# Patient Record
Sex: Female | Born: 1980 | Race: White | Hispanic: No | Smoking: Current every day smoker
Health system: Southern US, Community
[De-identification: ages and names within clinical notes are randomized; demographics above are authoritative.]

## PROBLEM LIST (undated history)

## (undated) DIAGNOSIS — M79609 Pain in unspecified limb: Secondary | ICD-10-CM

## (undated) DIAGNOSIS — F1120 Opioid dependence, uncomplicated: Secondary | ICD-10-CM

## (undated) DIAGNOSIS — F419 Anxiety disorder, unspecified: Secondary | ICD-10-CM

## (undated) DIAGNOSIS — Z87820 Personal history of traumatic brain injury: Secondary | ICD-10-CM

## (undated) DIAGNOSIS — F1910 Other psychoactive substance abuse, uncomplicated: Secondary | ICD-10-CM

## (undated) DIAGNOSIS — F32A Depression, unspecified: Secondary | ICD-10-CM

## (undated) DIAGNOSIS — M549 Dorsalgia, unspecified: Secondary | ICD-10-CM

## (undated) DIAGNOSIS — T7840XA Allergy, unspecified, initial encounter: Secondary | ICD-10-CM

## (undated) DIAGNOSIS — M5136 Other intervertebral disc degeneration, lumbar region: Secondary | ICD-10-CM

## (undated) DIAGNOSIS — M545 Low back pain, unspecified: Secondary | ICD-10-CM

## (undated) DIAGNOSIS — F329 Major depressive disorder, single episode, unspecified: Secondary | ICD-10-CM

## (undated) DIAGNOSIS — G4752 REM sleep behavior disorder: Secondary | ICD-10-CM

## (undated) DIAGNOSIS — F411 Generalized anxiety disorder: Secondary | ICD-10-CM

## (undated) DIAGNOSIS — Z8759 Personal history of other complications of pregnancy, childbirth and the puerperium: Secondary | ICD-10-CM

## (undated) DIAGNOSIS — F4310 Post-traumatic stress disorder, unspecified: Secondary | ICD-10-CM

## (undated) HISTORY — DX: Low back pain, unspecified: M54.50

## (undated) HISTORY — PX: CHOLECYSTECTOMY: SHX55

## (undated) HISTORY — DX: Post-traumatic stress disorder, unspecified: F43.10

## (undated) HISTORY — DX: Generalized anxiety disorder: F41.1

## (undated) HISTORY — DX: REM sleep behavior disorder: G47.52

## (undated) HISTORY — DX: Major depressive disorder, single episode, unspecified: F32.9

## (undated) HISTORY — DX: Other intervertebral disc degeneration, lumbar region: M51.36

## (undated) HISTORY — DX: Depression, unspecified: F32.A

## (undated) HISTORY — DX: Pain in unspecified limb: M79.609

## (undated) HISTORY — DX: Personal history of traumatic brain injury: Z87.820

## (undated) HISTORY — DX: Allergy, unspecified, initial encounter: T78.40XA

## (undated) HISTORY — DX: Personal history of other complications of pregnancy, childbirth and the puerperium: Z87.59

---

## 2002-09-23 ENCOUNTER — Inpatient Hospital Stay (HOSPITAL_COMMUNITY): Admission: AD | Admit: 2002-09-23 | Discharge: 2002-09-24 | Payer: Self-pay | Admitting: Obstetrics and Gynecology

## 2002-09-24 ENCOUNTER — Encounter: Payer: Self-pay | Admitting: Obstetrics and Gynecology

## 2005-08-10 ENCOUNTER — Other Ambulatory Visit (HOSPITAL_COMMUNITY): Admission: RE | Admit: 2005-08-10 | Discharge: 2005-11-08 | Payer: Self-pay | Admitting: Psychiatry

## 2006-03-21 ENCOUNTER — Ambulatory Visit (HOSPITAL_COMMUNITY): Admission: RE | Admit: 2006-03-21 | Discharge: 2006-03-21 | Payer: Self-pay | Admitting: Psychiatry

## 2006-05-29 ENCOUNTER — Emergency Department (HOSPITAL_COMMUNITY): Admission: EM | Admit: 2006-05-29 | Discharge: 2006-05-29 | Payer: Self-pay | Admitting: Emergency Medicine

## 2006-09-13 ENCOUNTER — Inpatient Hospital Stay (HOSPITAL_COMMUNITY): Admission: AD | Admit: 2006-09-13 | Discharge: 2006-09-13 | Payer: Self-pay | Admitting: Obstetrics and Gynecology

## 2010-03-07 ENCOUNTER — Encounter: Payer: Self-pay | Admitting: Psychiatry

## 2010-11-29 LAB — KLEIHAUER-BETKE STAIN: Quantitation Fetal Hemoglobin: 0

## 2012-09-20 ENCOUNTER — Inpatient Hospital Stay: Payer: Self-pay | Admitting: Internal Medicine

## 2012-09-20 LAB — BASIC METABOLIC PANEL
Anion Gap: 10 (ref 7–16)
BUN: 13 mg/dL (ref 7–18)
Calcium, Total: 8.3 mg/dL — ABNORMAL LOW (ref 8.5–10.1)
Creatinine: 1.38 mg/dL — ABNORMAL HIGH (ref 0.60–1.30)
EGFR (Non-African Amer.): 51 — ABNORMAL LOW
Glucose: 125 mg/dL — ABNORMAL HIGH (ref 65–99)
Osmolality: 274 (ref 275–301)
Potassium: 1.4 mmol/L — CL (ref 3.5–5.1)
Sodium: 136 mmol/L (ref 136–145)

## 2012-09-20 LAB — COMPREHENSIVE METABOLIC PANEL
Albumin: 3.2 g/dL — ABNORMAL LOW (ref 3.4–5.0)
Alkaline Phosphatase: 89 U/L (ref 50–136)
Anion Gap: 8 (ref 7–16)
Co2: 25 mmol/L (ref 21–32)
EGFR (African American): >60
Glucose: 125 mg/dL — ABNORMAL HIGH (ref 65–99)
Osmolality: 268 (ref 275–301)
Potassium: 1.6 mmol/L — CL (ref 3.5–5.1)
SGOT(AST): 37 U/L (ref 15–37)
Sodium: 133 mmol/L — ABNORMAL LOW (ref 136–145)

## 2012-09-20 LAB — CBC
HGB: 17.4 g/dL — ABNORMAL HIGH (ref 12.0–16.0)
MCHC: 35.5 g/dL (ref 32.0–36.0)
MCV: 75 fL — ABNORMAL LOW (ref 80–100)

## 2012-09-20 LAB — URINALYSIS, COMPLETE
Bilirubin,UR: NEGATIVE
Ketone: NEGATIVE
Nitrite: NEGATIVE
RBC,UR: <1 /HPF (ref 0–5)
Squamous Epithelial: 2

## 2012-09-20 LAB — DRUG SCREEN, URINE
Barbiturates, Ur Screen: NEGATIVE (ref ?–200)
Cocaine Metabolite,Ur ~~LOC~~: NEGATIVE (ref ?–300)
Methadone, Ur Screen: NEGATIVE (ref ?–300)

## 2012-09-21 LAB — BASIC METABOLIC PANEL
Calcium, Total: 7.5 mg/dL — ABNORMAL LOW (ref 8.5–10.1)
Chloride: 111 mmol/L — ABNORMAL HIGH (ref 98–107)
Chloride: 114 mmol/L — ABNORMAL HIGH (ref 98–107)
Co2: 21 mmol/L (ref 21–32)
Creatinine: 1.35 mg/dL — ABNORMAL HIGH (ref 0.60–1.30)
Creatinine: 1.38 mg/dL — ABNORMAL HIGH (ref 0.60–1.30)
EGFR (African American): 59 — ABNORMAL LOW
Glucose: 120 mg/dL — ABNORMAL HIGH (ref 65–99)
Osmolality: 282 (ref 275–301)
Potassium: 1.7 mmol/L — CL (ref 3.5–5.1)
Potassium: 2 mmol/L — CL (ref 3.5–5.1)
Sodium: 141 mmol/L (ref 136–145)

## 2012-09-21 LAB — CBC WITH DIFFERENTIAL/PLATELET
Eosinophil #: 1.6 10*3/uL — ABNORMAL HIGH (ref 0.0–0.7)
Eosinophil %: 7.8 %
HGB: 13.6 g/dL (ref 12.0–16.0)
MCHC: 36.4 g/dL — ABNORMAL HIGH (ref 32.0–36.0)
Neutrophil #: 14 10*3/uL — ABNORMAL HIGH (ref 1.4–6.5)
Platelet: 192 10*3/uL (ref 150–440)
RBC: 5.03 10*6/uL (ref 3.80–5.20)
RDW: 14.6 % — ABNORMAL HIGH (ref 11.5–14.5)
WBC: 20.3 10*3/uL — ABNORMAL HIGH (ref 3.6–11.0)

## 2012-09-21 LAB — MAGNESIUM: Magnesium: 2.2 mg/dL

## 2012-09-21 LAB — CLOSTRIDIUM DIFFICILE BY PCR

## 2012-09-22 LAB — CBC WITH DIFFERENTIAL/PLATELET
Basophil #: 0.1 10*3/uL (ref 0.0–0.1)
Basophil %: 0.5 %
Eosinophil %: 8.9 %
HCT: 32.9 % — ABNORMAL LOW (ref 35.0–47.0)
Lymphocyte #: 2.9 10*3/uL (ref 1.0–3.6)
Lymphocyte %: 21.2 %
MCH: 27 pg (ref 26.0–34.0)
Monocyte #: 0.7 x10 3/mm (ref 0.2–0.9)
Neutrophil %: 64.1 %
Platelet: 162 10*3/uL (ref 150–440)
RBC: 4.34 10*6/uL (ref 3.80–5.20)

## 2012-09-22 LAB — BASIC METABOLIC PANEL
Calcium, Total: 7.4 mg/dL — ABNORMAL LOW (ref 8.5–10.1)
Creatinine: 0.88 mg/dL (ref 0.60–1.30)
Osmolality: 279 (ref 275–301)
Potassium: 3.2 mmol/L — ABNORMAL LOW (ref 3.5–5.1)

## 2012-09-22 LAB — MAGNESIUM: Magnesium: 1.9 mg/dL

## 2012-09-23 LAB — STOOL CULTURE

## 2013-07-14 ENCOUNTER — Emergency Department: Payer: Self-pay | Admitting: Emergency Medicine

## 2013-07-14 ENCOUNTER — Emergency Department: Payer: Self-pay | Admitting: Internal Medicine

## 2014-06-06 NOTE — Discharge Summary (Signed)
PATIENT NAME:  Vicki [name] [name], Vicki [name] MR#:  [id number] DATE OF BIRTH:  02-23-1980  DATE OF ADMISSION:  09/20/2012 DATE OF DISCHARGE:  09/22/2012  ADMITTING DIAGNOSES:  Intractable diarrhea.   DISCHARGE DIAGNOSES:   1.  Hypokalemia, hypomagnesium due to diarrhea.  2.  Diarrhea, likely food poisoning per gastroenterology. 3.  Leukocytosis.  4.  Oral thrush.   5.  History of lower back pains.   DISCHARGE CONDITION:  Stable.   DISCHARGE MEDICATIONS:  The patient is to resume alprazolam 1 mg 3 times daily as needed.  Nystatin 5 mL every six hours.  This is a new medication.  Potassium chloride 20 mEq by mouth twice daily, magnesium oxide 400 mg by mouth twice daily.   DIET:  Regular, mechanical soft.  The patient was advised to advance to regular in the next few days.   ACTIVITY LIMITATIONS:  As tolerated.   FOLLOW-UP APPOINTMENT:  With Dr. Isabella Bowensld in two days after discharge.  The patient was advised also to have magnesium as well as potassium levels checked in the next two days and to have it reported to Dr. Joaquin Musicld's (Dictation Anomaly) office.  Decision then should be made in regards to supplementation, potassium as well as magnesium supplementation orally.   CONSULTANTS:   1.  Care management, Ms. Amedeo KinsmanKimberly Mills.  2.  Dr. Lynnae Prudeobert Elliott.  3.  Dr. Ida Roguehristopher Lundquist.   PROCEDURES:  Ultrasound-guided right internal jugular central venous catheter placement on 09/20/2012.   HOSPITAL COURSE:   1.  The patient is a 34 year old Caucasian female with history of diarrhea who presented to the hospital with complaints of difficulty with walking.  Please refer to Dr. Serita GritShreyang Patel's admission note on 09/20/2012.  Apparently the patient had illness approximately 15 days ago prior to coming to the hospital which sounded like food poisoning.  She was over it after a few days, however she walked in the heat for approximately two miles yesterday on 09/20/2012 and had severe muscular problems as well as weakness.   She presented to the hospital for further evaluation, was found to be hypokalemic and was admitted.  She still was having intermittent loose stools.  Because of concern of Salmonella as well as Campylobacter, Dr. Mechele CollinElliott felt that the patient's diarrhea should not be treated with antidiarrheal medications until stool cultures are negative.  Stool cultures were taken and were found to be negative for Salmonella, Shigella, Campylobacter or pathogenic E. coli.  With conservative treatment, IV fluid administration as well as nausea medications, the patient's condition improved.  On the day of admission 09/20/2012 the patient was noted to be in mild renal insufficiency with creatinine level at 1.33.  Her sodium level was low at 133 and potassium level was very low at 1.6.  Magnesium level was also low at 1.4.  The patient's electrolytes were replenished.  On day of discharge 09/22/2012 the patient's potassium is 3.2.  The patient's magnesium level is 1.9.  The patient was advised to continue magnesium as well as potassium supplementation and have magnesium as well as potassium levels rechecked in the next few days after discharge and report it to Dr. Isabella Bowensld.  She is also recommended to return back to Dr. Mechele CollinElliott if any other problems or diarrhea recur.  She was also checked for C. difficile and that was negative.   2.  The patient was noted to have also thrush for which she was given nystatin suspension.  She is to continue this medication until oral thrush resolves.  3.  For history of lower back pains, the patient is to continue her usual management.   She is being discharged in stable condition with the above-mentioned medications and follow-up.    Her vital signs on the day of discharge temperature is 97.9, pulse was 80s, respiratory rate was 15 to 20, blood pressure 119/79, O2 sats were 100% on room air at rest.   TIME SPENT:  40 minutes.    ____________________________ Katharina Caper,  MD rv:ea D: 09/22/2012 15:18:36 ET T: 09/22/2012 23:09:29 ET JOB#: [id number]  cc: Katharina Caper, MD, <Dictator> Anjelita Sheahan MD ELECTRONICALLY SIGNED 09/23/2012 19:09

## 2014-06-06 NOTE — Consult Note (Signed)
PATIENT NAME:  Vicki [name], DUNGEE MR#:  [id number] DATE OF BIRTH:  [birth year]  DATE OF CONSULTATION:  09/21/2012  REFERRING PHYSICIAN:  Dr. Imogene Burn   CONSULTING PHYSICIAN: Molly Maduro Elliott/Terriann Difonzo Arvilla Market, ANP   REASON FOR CONSULTATION: Diarrhea.   HISTORY OF PRESENT ILLNESS: This patient reports she is normally healthy and had an acute episode of nausea, vomiting and diarrhea that she is certain is related to food poisoning from a poorly cooked chicken thigh. She was vacationing in [place] and had eaten at a friend's house. Four people developed acute diarrheal symptoms about 36 to 48 hours after eating this chicken. The patient says she has had food poisoning in the past and she is certain this is what her diarrhea was from. During this timeframe, she has also had urinary tract infection and was given a 7-day course of Cipro which she completed. Her diarrheal illness lasted about six days and then eased off to the point where she was having a formed stool. She never had fevers or chills or abdominal pain or bloody stools. Diarrheal illness, she has had decreased appetite and she lost about 20 pounds in a week's time.   The patient was able to be well enough to drive across country with her boyfriend. She went on a mile walk on Wednesday and had been drinking water, tea and eating one meal a day. She noticed her right foot felt numb and she was more weak and fatigued than normal. She thought perhaps the right foot symptoms were secondary to her history of three herniated back disc. Last night at about 2:00 in the morning, both legs were numb and so weak she was unable to walk. She fell on the way to the bathroom. Her parents called EMS and she was transported to the Emergency Room where she was found to have potassium of 1.6 to 1.4 and leukocytosis. She was subsequently admitted to the hospital for further evaluation and management. Gastroenterology has been consulted surgery regarding her diarrhea.   The  patient reports her bowel habits are always normal. She has had no problems with irritable bowel syndrome. No history of colonoscopy. She has had problems with morbid obesity and she has a 34 year old she went on a crash diet and lost 100 in one summer. She then developed gallstones that were trapped in her bile duct and had to have an emergency cholecystectomy at age 34. The patient reports that her weight has waxed and waned. Recently she is down 20 pounds as noted. Her appetite not yet back to normal, and except for today she was able to eat her a good meal and several days which was vegetable lasagna and home fries. She had three loose stools today. The patient was given antidiarrheal medication and that seemed to check the stool. She denies any abdominal pain, rectal bleeding or upper gastrointestinal complaints.   PAST MEDICAL HISTORY: 1.  Morbid obesity.  2.  Herniated disc.   PAST SURGICAL HISTORY: 1.  Cesarean section.  2.  Cholecystectomy and the patient reports she had multiple gallstones requiring emergency cholecystectomy following profound acute weight loss.  MEDICATIONS ON ARRIVAL:  1.  Alprazolam 1 mg t.i.d.  2.  Oxycodone 6 tablets daily for back pain.   ALLERGIES: AMOXICILLIN.   HABITS: Positive tobacco 1/2 pack per day. Denies alcohol. Denies drug use.   FAMILY HISTORY: Father with diabetes. Negative for colon cancer, colon polyps, or GI malignancy.   REVIEW OF SYSTEMS:  CONSTITUTIONAL: Denies fevers, still has a little fatigue  and weakness but states she feels a lot better from admission.  EYES: Denies any changes.  ENT: Denies any hearing change, ear pain.  RESPIRATORY: Denies cough, shortness of breath, dyspnea on exertion.  CARDIOVASCULAR: Denies chest pain or arrhythmia.  GASTROINTESTINAL: Positive nausea, appetite is still off, but she feels like she has getting hungry and wanting to eat. Loose stool today x 3. Denies rectal bleeding.  GENITOURINARY: History of  urinary tract infection a few weeks ago. Denies hematuria, dysuria or frequency.  ENDOCRINE: Denies diabetes, thyroid problems.  HEMATOLOGIC: Denies bruising.  SKIN: No acute rash. No hair or skin changes.  MUSCULOSKELETAL: Does  have chronic pain in the back, requires narcotics.  Leg weakness and numbness have resolved.   PSYCHIATRIC: Denies anxiety or depression says. She says she is getting tired of lying in the bed.   PHYSICAL EXAMINATION: VITAL SIGNS: Temperature 97.7, respirations 88, pulse 20, blood pressure 117/79, oxygen saturation on room air is 100%.  GENERAL: Well-appearing, obese Caucasian female resting comfortably in bed.  HEENT: Head is normocephalic. Conjunctivae pink. Sclerae anicteric. Oral mucosa is dry and intact.  NECK: Supple without thyromegaly or lymphadenopathy.  HEART: Heart tones S1, S2 without murmur, rub, or gallop.  LUNGS: Clear to auscultation. Respirations are eupneic.  ABDOMEN: Soft, very mild tenderness left lower quadrant without rigidity, rebound, or guarding.  RECTAL: Deferred.  EXTREMITIES: Lower extremities with obesity and may have slight edema. Noted no cyanosis or clubbing.  MUSCULOSKELETAL: The patient is able to sit up in bed independently. Moves about independently. Extremities x 4 with movement.  NEUROLOGIC:  Cranial nerves II through XII grossly intact. Reflexes not checked.  LABORATORY DATA:  Admission blood work 09/20/2012 with glucose 125, sodium 133, BUN 12, creatinine 1.33, potassium 1.6.   CO2 25, magnesium 1.4. Hemoglobin A1c 5.4, total bilirubin 1.3, albumin 3.2. WBC 21, hemoglobin 17.4, HIV negative. Urine pregnancy negative. Urine drug screen positive for benzodiazepines, positive for cannabinoid, positive for opiate.   Repeat laboratory studies through 09/21/2012 now with potassium today 3.5, most recent creatinine 1.38, TSH 0.71, WBC 20.3, hemoglobin 13.6, MCV 74. Blood culture growth negative. Stool negative for C. difficile.  Comprehensive stool study ordered not obtained, ordered and pending collection.   RADIOLOGY: Single view portable chest with right jugular central line and central line in place, hazy density seen in the right care perihilar region, which could be secondary to shallow inspiration. Minimal perihilar atelectasis or evolving pneumonia is not excluded.   IMPRESSION: The patient gives history of visibly poorly cooked chicken thigh and acute vomiting, diarrheal illness 36 to 48 hours after ingesting meat. She is one of four friends who acquired this food poisoning. Acute diarrhea with severe for about six days. In this timeframe she did have the use of Cipro 500 b.i.d. for seven days for urinary tract infection. Diarrheal illness, eventually improved to the point where she was having formed stool, although consistency not quite back to normal and she has persistent low appetite and 20 pounds weight loss in a week. She developed severe extremity weakness and numbness and was found to have severe hypokalemia. She has had leukocytosis reactive to GI illness and stool for C. difficile was negative and blood culture negative. She has had no febrile illness and feels improved. Mild tenderness left lower quadrant on exam today is consistent with likely bacterial diarrheal colitis.  The abdomen is soft, nondistended, and the patient is unaware of abdominal pain without direct palpation. Electrolytes have improved and the patient  says she feels overall much improved. She did eat an aggressive diet today as a first meal. She did eat a high fiber diet today with some grease and developed three loose bowel movements when checked with antidiarrheal medication.   PLAN: Reduce diet to at least low roughage diet. Mild tenderness left lower quadrant noted,  leukocytosis, still present and diarrhea returned with diet. Would recommend decreasing to low residue at least, possibly full liquid, obtain stool for comprehensive culture,  rule out Campylobacter/Salmonella given the exposure to the poorly cooked chicken. Further gastroenterology recommendations per Dr. Mechele CollinElliott review. This case will be discussed with Dr. Mechele CollinElliott in collaboration of care. Thank you for the consultation.    ____________________________ Ranae PlumberKimberly A. Arvilla MarketMills, ANP (Adult Nurse Practitioner) kam:cc D: 09/21/2012 17:29:54 ET T: 09/21/2012 21:22:19 ET JOB#: [id number]  cc: Cala BradfordKimberly A. Arvilla MarketMills, ANP (Adult Nurse Practitioner), <Dictator> Ranae PlumberKimberly A. Suzette BattiestMills RN, MSN, ANP-BC Adult Nurse Practitioner ELECTRONICALLY SIGNED 09/26/2012 12:13

## 2014-06-06 NOTE — Consult Note (Signed)
CC: severe hypokalemia.  Pt had illness 15 d ago sounds like food poisoning, got over it after 6 days.  Walked in heat for 2 miles yesterday had severe muscular problems and weakness.  Found to be severely hypokalemic and admitted.  Pt with some loose stools today.  It is possible to have relapses of Salmonella or Campylobacter  so it is important not to treat her diarrhea with antidiarrheals until we have a stool culture that is neg.  C, diff today was neg.  Pt did take cipro for uti after her food poisoning and that may have been enough.  I will follow with you.  Electronic Signatures: Scot JunElliott, Nikoletta Varma T (MD)  (Signed on 08-Aug-14 20:44)  Authored  Last Updated: 08-Aug-14 20:44 by Scot JunElliott, Kennen Stammer T (MD)

## 2014-06-06 NOTE — Op Note (Signed)
PATIENT NAME:  Vicki [name], Vicki [name] MR#:  [id number] DATE OF BIRTH:  06-11-1980  DATE OF PROCEDURE:  09/20/2012  ATTENDING PHYSICIAN: Salome Holmeshris Cass Edinger, M.D.   PREOPERATIVE DIAGNOSES: Hypokalemia, poor intravenous access.   POSTOPERATIVE DIAGNOSES: Hypokalemia, poor intravenous access.   PROCEDURE PERFORMED: Right internal jugular central venous catheter placement.   ESTIMATED BLOOD LOSS: 5 mL.   COMPLICATIONS: None.   SPECIMENS: None.   ANESTHESIA: Lidocaine 1% with epinephrine.   INDICATION FOR SURGERY: As follows: [name] is a pleasant 34 year old female who presented with profound hypokalemia. She was in need of IV access, and, thus, she was consented for central venous catheter placement.   DETAILS OF PROCEDURE: As follows: Informed consent was obtained. [name] was laid in the Trendelenburg position. An ultrasound was placed over her neck, and her right internal jugular vein was a larger caliber than her left. It also collapsed with inhalation, indicating dehydration. I, thus, then prepped her neck and draped her in sterile surgical fashion. A timeout was then performed correctly identifying the patient name, operative site and procedure to be performed. Using an ultrasound, I visualized her internal jugular vein and under direct visualization after 2 sticks, I accessed it easily. There was return of nonpulsatile blood. I then placed the wire through the needle and removed the needle. I then dilated the tract and then placed a central venous catheter over the wire and removed the wire. I then flushed the catheter, all 3 ports. There was easy flow and flush of the catheter. I then sutured it in place with a 3-0 silk suture. A sterile dressing was then placed over the catheter. Postprocedure chest x-ray showed appropriate position of the catheter without obvious pneumothorax. Needle, sponge and instrument counts were correct at the end of the procedure.     ____________________________ Si Raiderhristopher A. Gotti Alwin, MD cal:gb D: 09/20/2012 20:30:15 ET T: 09/21/2012 04:33:14 ET JOB#: [id number]  cc: Cristal Deerhristopher A. Apolinar Bero, MD, <Dictator> Jarvis NewcomerHRISTOPHER A Hideko Esselman MD ELECTRONICALLY SIGNED 09/28/2012 10:50

## 2014-06-06 NOTE — H&P (Signed)
PATIENT NAME:  Vicki [name], Vicki [name] MR#:  [id number] DATE OF BIRTH:  05/04/1980  DATE OF ADMISSION:  09/20/2012  ED REFERRING PHYSICIAN: Dr. Lenard Lance.   PRIMARY CARE PHYSICIAN: None local. She has a physician in [place].   CHIEF COMPLAINT: Difficulty with walking.   HISTORY OF PRESENT ILLNESS: The patient is a 34 year old Caucasian female with history of having chronic low back pain who came to the ED with difficulty with ambulation. The patient apparently was sick for the past few days. She reports that she developed diarrhea, nausea, vomiting about five days ago, which was severe. She was going at least 20 times a day. Her boyfriend had similar type of symptoms, initially and then she got a day same symptoms. She also reports that she was treated with Cipro about a week ago for urinary tract infection. She has not had any fevers or chills. She thinks that they may have gotten a food poisoning. Her symptoms continued to persist and there severe until yesterday when her nausea, vomiting, diarrhea, also subsided. She did not have any abdominal pain. She did not have any blood. The patient yesterday had difficulty with walking and she progressively got weak and came to the ED today. She was noted to have severe per hypokalemia with potassium of 1.6. She also has a leukocytosis and we are asked to admit the patient. The patient reports that she just feels very weak and tired, but does not have any chest pain. No shortness of breath. No palpitations. No abdominal pain. No more nausea, vomiting or diarrhea. Denies any fevers or chills.   PAST MEDICAL HISTORY: Significant for chronic low back pain and has a herniated disk in her lower back.   PAST SURGICAL HISTORY: Status post C-section.   ALLERGIES: AMOXICILLIN.   CURRENT MEDICATIONS: She says she is on alprazolam 1 mg t.i.d. as needed for anxiety.   SOCIAL HISTORY: Smokes about 1/2 pack per day. Denies any alcohol or drug use.   FAMILY HISTORY: No history  of Crohn's disease or  ulcerative colitis.   REVIEW OF SYSTEMS:  CONSTITUTIONAL: Denies any fevers. Complains of fatigue, weakness, has chronic back pain, did report that she has lost weight over the past few months.  EYES: No blurred or double vision. No pain. No redness. No inflammation. No glaucoma. No cataracts.  ENT: No tinnitus. No ear pain. No hearing loss. No seasonal or year-round allergies. No difficulty with swallowing.  RESPIRATORY: Denies any cough, wheezing, hemoptysis. No dyspnea. No chronic obstructive pulmonary disease no tuberculosis.  CARDIOVASCULAR: Denies any chest pain, orthopnea, edema or arrhythmia.  GASTROINTESTINAL: Complains of nausea, vomiting and diarrhea that stopped yesterday. No abdominal pain. No hematemesis. No melena. No ulcer. No GERD. No irritable bowel syndrome. No jaundice. No rectal bleeding.  GENITOURINARY: Denies any dysuria, hematuria, renal calculus or frequency.  ENDOCRINE: Denies any polyuria or nocturia or thyroid problems  HEMATOLOGIC/LYMPHATIC:  She denies any easy bruisability or bleeding.  SKIN: No acne or rash. No changes in mole, hair or skin.  MUSCULOSKELETAL: Does have pain in her back. No gout.  NEUROLOGIC: Numbness. No CVA. No transient ischemic attack.  PSYCHIATRIC: No anxiety. No insomnia. No ADD.   PHYSICAL EXAMINATION: VITAL SIGNS: Temperature 98, pulse 107, respirations 18, blood pressure 113/71, O2 97%.  GENERAL: The patient is a morbidly obese Caucasian female who appears very dehydrated.  HEENT: Head atraumatic, normocephalic. Pupils equally round, reactive to light and accommodation. There is no conjunctival pallor. No scleral icterus. Nasal exam shows no drainage  or ulceration. Oropharynx is very dry. She has thrush in her oropharynx. Ear exam shows no drainage or ulceration.  NOSE: No nasal lesions or drainage.  NECK: Supple and symmetric. No masses. Thyroid midline.  LUNGS: Clear to auscultation bilaterally without any rales,  rhonchi, wheezing.  ABDOMEN: Soft, nontender, nondistended. Positive bowel sounds x 4. No hepatosplenomegaly.  GENITOURINARY: Deferred.  MUSCULOSKELETAL: There is no erythema or swelling.  SKIN: There is no rash. Skin clear is very dry.  LYMPHATICS: No lymph nodes palpable.  VASCULAR: Good DP, PT pulses.   NEUROLOGICAL: Cranial nerves II through XII grossly intact. Reflexes 2+.  PSYCHIATRIC: Not anxious or depressed. Urine pregnancy was negative. WBC 21.1, hemoglobin 17.4, platelet count 236, MCV 75. BMP: Glucose 125, BUN 12, creatinine 1.33, sodium 133, potassium 1.6, chloride 100, CO2 is 25, calcium 9.1 bilirubin total 1.3, alkaline phosphatase 89, ALT is 44, AST is 37, total protein 7.4.   Urinalysis: Nitrites negative, leukocytes negative., urine durg screen are positive for opioids, THC and benzodiazepines. Magnesium 1.4.   EKG shows some normal sinus rhythm, she has U waves on her EKG.   ASSESSMENT AND PLAN: The patient is a 34 year old white female who developed diarrhea. days ago was going to bathroom, 20 times a day, yesterday nausea and vomiting resolved, but now comes in with difficulty with ambulation, noted to have severe hypokalemia.  1.  Severe hypokalemia. Likely due to her diarrhea and nausea and vomiting. She has received one dose of IV KCl 40 mEq. I will start her on oral 60 mEq q. 6 hours.  Also placed potassium in her fluids. We will follow her BMP every four hours. Replace her potassium. The patient's magnesium was also low, so we are replacing that as well and started on by mouth magnesium. We will have to monitor her closely on telemetry. The patient at very high risk of bradycardia and cardiovascular collapse in light of her severe electrolyte imbalances.  2.  Leukocytosis, possibly reactive from gastrointestinal illness. We will get stool studies if her diet if her diarrhea recurs. We will get a blood culture. Her urinalysis is negative. We will also check a chest x-ray to  completeness sake.  3.  Oral thrush. We will start her on nystatin swish and swallow. The patient was recently on antibiotics so that could explain her thrush; however, with her diarrhea and her age at this time HIV needs to be ruled out, so I will order HIV. The patient consents to HIV testing.  4.  Hypomagnesemia. We will replace her magnesium.  5.  Miscellaneous.  I will place her on Lovenox for deep vein thrombosis prophylaxis.  6.  In light of the patient's severe hypokalemia, this will need closer telemetry monitoring and supplements.    TIME SPENT: 50 minutes  ____________________________ Serita GritShreyang H. Allena KatzPatel, MD shp:cc D: 09/20/2012 13:33:37 ET T: 09/20/2012 14:15:05 ET JOB#: [id number]  cc: Bron Snellings H. Allena KatzPatel, MD, <Dictator> Charise CarwinSHREYANG H Bula Cavalieri MD ELECTRONICALLY SIGNED 09/24/2012 10:30

## 2016-02-15 HISTORY — DX: Maternal care for unspecified type scar from previous cesarean delivery: O34.219

## 2017-01-09 ENCOUNTER — Encounter: Payer: Self-pay | Admitting: Advanced Practice Midwife

## 2017-01-27 ENCOUNTER — Encounter: Payer: Self-pay | Admitting: Obstetrics and Gynecology

## 2017-01-27 ENCOUNTER — Ambulatory Visit (INDEPENDENT_AMBULATORY_CARE_PROVIDER_SITE_OTHER): Payer: Medicaid Other | Admitting: Obstetrics and Gynecology

## 2017-01-27 ENCOUNTER — Other Ambulatory Visit (HOSPITAL_COMMUNITY)
Admission: RE | Admit: 2017-01-27 | Discharge: 2017-01-27 | Disposition: A | Discharge status: Home / Self Care | Payer: Medicaid Other | Source: Ambulatory Visit | Attending: Obstetrics and Gynecology | Admitting: Obstetrics and Gynecology

## 2017-01-27 VITALS — BP 108/76 | HR 97 | Ht 67.0 in | Wt 215.0 lb

## 2017-01-27 DIAGNOSIS — O34219 Maternal care for unspecified type scar from previous cesarean delivery: Secondary | ICD-10-CM

## 2017-01-27 DIAGNOSIS — O0992 Supervision of high risk pregnancy, unspecified, second trimester: Secondary | ICD-10-CM | POA: Insufficient documentation

## 2017-01-27 DIAGNOSIS — Z3481 Encounter for supervision of other normal pregnancy, first trimester: Secondary | ICD-10-CM | POA: Insufficient documentation

## 2017-01-27 DIAGNOSIS — O09529 Supervision of elderly multigravida, unspecified trimester: Secondary | ICD-10-CM

## 2017-01-27 DIAGNOSIS — Z3480 Encounter for supervision of other normal pregnancy, unspecified trimester: Secondary | ICD-10-CM

## 2017-01-27 DIAGNOSIS — Z3687 Encounter for antenatal screening for uncertain dates: Secondary | ICD-10-CM | POA: Diagnosis not present

## 2017-01-27 DIAGNOSIS — O09521 Supervision of elderly multigravida, first trimester: Secondary | ICD-10-CM | POA: Diagnosis not present

## 2017-01-27 DIAGNOSIS — Z8739 Personal history of other diseases of the musculoskeletal system and connective tissue: Secondary | ICD-10-CM

## 2017-01-27 HISTORY — DX: Personal history of other diseases of the musculoskeletal system and connective tissue: Z87.39

## 2017-01-27 MED ORDER — DOXYLAMINE-PYRIDOXINE 10-10 MG PO TBEC: 2.0000 | DELAYED_RELEASE_TABLET | Freq: Every day | ORAL | 5 refills | Status: DC

## 2017-01-27 NOTE — Patient Instructions (Addendum)
 Second Trimester of Pregnancy The second trimester is from week 14 through week 27 (months 4 through 6). The second trimester is often a time when you feel your best. Your body has adjusted to being pregnant, and you begin to feel better physically. Usually, morning sickness has lessened or quit completely, you may have more energy, and you may have an increase in appetite. The second trimester is also a time when the fetus is growing rapidly. At the end of the sixth month, the fetus is about 9 inches long and weighs about 1 pounds. You will likely begin to feel the baby move (quickening) between 16 and 20 weeks of pregnancy. Body changes during your second trimester Your body continues to go through many changes during your second trimester. The changes vary from woman to woman.  Your weight will continue to increase. You will notice your lower abdomen bulging out.  You may begin to get stretch marks on your hips, abdomen, and breasts.  You may develop headaches that can be relieved by medicines. The medicines should be approved by your health care provider.  You may urinate more often because the fetus is pressing on your bladder.  You may develop or continue to have heartburn as a result of your pregnancy.  You may develop constipation because certain hormones are causing the muscles that push waste through your intestines to slow down.  You may develop hemorrhoids or swollen, bulging veins (varicose veins).  You may have back pain. This is caused by: ? Weight gain. ? Pregnancy hormones that are relaxing the joints in your pelvis. ? A shift in weight and the muscles that support your balance.  Your breasts will continue to grow and they will continue to become tender.  Your gums may bleed and may be sensitive to brushing and flossing.  Dark spots or blotches (chloasma, mask of pregnancy) may develop on your face. This will likely fade after the baby is born.  A dark line from  your belly button to the pubic area (linea nigra) may appear. This will likely fade after the baby is born.  You may have changes in your hair. These can include thickening of your hair, rapid growth, and changes in texture. Some women also have hair loss during or after pregnancy, or hair that feels dry or thin. Your hair will most likely return to normal after your baby is born.  What to expect at prenatal visits During a routine prenatal visit:  You will be weighed to make sure you and the fetus are growing normally.  Your blood pressure will be taken.  Your abdomen will be measured to track your baby's growth.  The fetal heartbeat will be listened to.  Any test results from the previous visit will be discussed.  Your health care provider may ask you:  How you are feeling.  If you are feeling the baby move.  If you have had any abnormal symptoms, such as leaking fluid, bleeding, severe headaches, or abdominal cramping.  If you are using any tobacco products, including cigarettes, chewing tobacco, and electronic cigarettes.  If you have any questions.  Other tests that may be performed during your second trimester include:  Blood tests that check for: ? Low iron levels (anemia). ? High blood sugar that affects pregnant women (gestational diabetes) between 24 and 28 weeks. ? Rh antibodies. This is to check for a protein on red blood cells (Rh factor).  Urine tests to check for infections, diabetes,   or protein in the urine.  An ultrasound to confirm the proper growth and development of the baby.  An amniocentesis to check for possible genetic problems.  Fetal screens for spina bifida and Down syndrome.  HIV (human immunodeficiency virus) testing. Routine prenatal testing includes screening for HIV, unless you choose not to have this test.  Follow these instructions at home: Medicines  Follow your health care provider's instructions regarding medicine use. Specific  medicines may be either safe or unsafe to take during pregnancy.  Take a prenatal vitamin that contains at least 600 micrograms (mcg) of folic acid.  If you develop constipation, try taking a stool softener if your health care provider approves. Eating and drinking  Eat a balanced diet that includes fresh fruits and vegetables, whole grains, good sources of protein such as meat, eggs, or tofu, and low-fat dairy. Your health care provider will help you determine the amount of weight gain that is right for you.  Avoid raw meat and uncooked cheese. These carry germs that can cause birth defects in the baby.  If you have low calcium intake from food, talk to your health care provider about whether you should take a daily calcium supplement.  Limit foods that are high in fat and processed sugars, such as fried and sweet foods.  To prevent constipation: ? Drink enough fluid to keep your urine clear or pale yellow. ? Eat foods that are high in fiber, such as fresh fruits and vegetables, whole grains, and beans. Activity  Exercise only as directed by your health care provider. Most women can continue their usual exercise routine during pregnancy. Try to exercise for 30 minutes at least 5 days a week. Stop exercising if you experience uterine contractions.  Avoid heavy lifting, wear low heel shoes, and practice good posture.  A sexual relationship may be continued unless your health care provider directs you otherwise. Relieving pain and discomfort  Wear a good support bra to prevent discomfort from breast tenderness.  Take warm sitz baths to soothe any pain or discomfort caused by hemorrhoids. Use hemorrhoid cream if your health care provider approves.  Rest with your legs elevated if you have leg cramps or low back pain.  If you develop varicose veins, wear support hose. Elevate your feet for 15 minutes, 3-4 times a day. Limit salt in your diet. Prenatal Care  Write down your questions.  Take them to your prenatal visits.  Keep all your prenatal visits as told by your health care provider. This is important. Safety  Wear your seat belt at all times when driving.  Make a list of emergency phone numbers, including numbers for family, friends, the hospital, and police and fire departments. General instructions  Ask your health care provider for a referral to a local prenatal education class. Begin classes no later than the beginning of month 6 of your pregnancy.  Ask for help if you have counseling or nutritional needs during pregnancy. Your health care provider can offer advice or refer you to specialists for help with various needs.  Do not use hot tubs, steam rooms, or saunas.  Do not douche or use tampons or scented sanitary pads.  Do not cross your legs for long periods of time.  Avoid cat litter boxes and soil used by cats. These carry germs that can cause birth defects in the baby and possibly loss of the fetus by miscarriage or stillbirth.  Avoid all smoking, herbs, alcohol, and unprescribed drugs. Chemicals in these products   can affect the formation and growth of the baby.  Do not use any products that contain nicotine or tobacco, such as cigarettes and e-cigarettes. If you need help quitting, ask your health care provider.  Visit your dentist if you have not gone yet during your pregnancy. Use a soft toothbrush to brush your teeth and be gentle when you floss. Contact a health care provider if:  You have dizziness.  You have mild pelvic cramps, pelvic pressure, or nagging pain in the abdominal area.  You have persistent nausea, vomiting, or diarrhea.  You have a bad smelling vaginal discharge.  You have pain when you urinate. Get help right away if:  You have a fever.  You are leaking fluid from your vagina.  You have spotting or bleeding from your vagina.  You have severe abdominal cramping or pain.  You have rapid weight gain or weight  loss.  You have shortness of breath with chest pain.  You notice sudden or extreme swelling of your face, hands, ankles, feet, or legs.  You have not felt your baby move in over an hour.  You have severe headaches that do not go away when you take medicine.  You have vision changes. Summary  The second trimester is from week 14 through week 27 (months 4 through 6). It is also a time when the fetus is growing rapidly.  Your body goes through many changes during pregnancy. The changes vary from woman to woman.  Avoid all smoking, herbs, alcohol, and unprescribed drugs. These chemicals affect the formation and growth your baby.  Do not use any tobacco products, such as cigarettes, chewing tobacco, and e-cigarettes. If you need help quitting, ask your health care provider.  Contact your health care provider if you have any questions. Keep all prenatal visits as told by your health care provider. This is important. This information is not intended to replace advice given to you by your health care provider. Make sure you discuss any questions you have with your health care provider. Document Released: 01/25/2001 Document Revised: 07/09/2015 Document Reviewed: 04/03/2012 Elsevier Interactive Patient Education  2017 [employer]  Contraception Choices Contraception (birth control) is the use of any methods or devices to prevent pregnancy. Below are some methods to help avoid pregnancy. Hormonal methods  Contraceptive implant. This is a thin, plastic tube containing progesterone hormone. It does not contain estrogen hormone. Your health care provider inserts the tube in the inner part of the upper arm. The tube can remain in place for up to 3 years. After 3 years, the implant must be removed. The implant prevents the ovaries from releasing an egg (ovulation), thickens the cervical mucus to prevent sperm from entering the uterus, and thins the lining of the inside of the  uterus.  Progesterone-only injections. These injections are given every 3 months by your health care provider to prevent pregnancy. This synthetic progesterone hormone stops the ovaries from releasing eggs. It also thickens cervical mucus and changes the uterine lining. This makes it harder for sperm to survive in the uterus.  Birth control pills. These pills contain estrogen and progesterone hormone. They work by preventing the ovaries from releasing eggs (ovulation). They also cause the cervical mucus to thicken, preventing the sperm from entering the uterus. Birth control pills are prescribed by a health care provider.Birth control pills can also be used to treat heavy periods.  Minipill. This type of birth control pill contains only the progesterone hormone. They are taken every day   of each month and must be prescribed by your health care provider.  Birth control patch. The patch contains hormones similar to those in birth control pills. It must be changed once a week and is prescribed by a health care provider.  Vaginal ring. The ring contains hormones similar to those in birth control pills. It is left in the vagina for 3 weeks, removed for 1 week, and then a new one is put back in place. The patient must be comfortable inserting and removing the ring from the vagina.A health care provider's prescription is necessary.  Emergency contraception. Emergency contraceptives prevent pregnancy after unprotected sexual intercourse. This pill can be taken right after sex or up to 5 days after unprotected sex. It is most effective the sooner you take the pills after having sexual intercourse. Most emergency contraceptive pills are available without a prescription. Check with your pharmacist. Do not use emergency contraception as your only form of birth control. Barrier methods  Female condom. This is a thin sheath (latex or rubber) that is worn over the penis during sexual intercourse. It can be used with  spermicide to increase effectiveness.  Female condom. This is a soft, loose-fitting sheath that is put into the vagina before sexual intercourse.  Diaphragm. This is a soft, latex, dome-shaped barrier that must be fitted by a health care provider. It is inserted into the vagina, along with a spermicidal jelly. It is inserted before intercourse. The diaphragm should be left in the vagina for 6 to 8 hours after intercourse.  Cervical cap. This is a round, soft, latex or plastic cup that fits over the cervix and must be fitted by a health care provider. The cap can be left in place for up to 48 hours after intercourse.  Sponge. This is a soft, circular piece of polyurethane foam. The sponge has spermicide in it. It is inserted into the vagina after wetting it and before sexual intercourse.  Spermicides. These are chemicals that kill or block sperm from entering the cervix and uterus. They come in the form of creams, jellies, suppositories, foam, or tablets. They do not require a prescription. They are inserted into the vagina with an applicator before having sexual intercourse. The process must be repeated every time you have sexual intercourse. Intrauterine contraception  Intrauterine device (IUD). This is a T-shaped device that is put in a woman's uterus during a menstrual period to prevent pregnancy. There are 2 types: ? Copper IUD. This type of IUD is wrapped in copper wire and is placed inside the uterus. Copper makes the uterus and fallopian tubes produce a fluid that kills sperm. It can stay in place for 10 years. ? Hormone IUD. This type of IUD contains the hormone progestin (synthetic progesterone). The hormone thickens the cervical mucus and prevents sperm from entering the uterus, and it also thins the uterine lining to prevent implantation of a fertilized egg. The hormone can weaken or kill the sperm that get into the uterus. It can stay in place for 3-5 years, depending on which type of IUD  is used. Permanent methods of contraception  Female tubal ligation. This is when the woman's fallopian tubes are surgically sealed, tied, or blocked to prevent the egg from traveling to the uterus.  Hysteroscopic sterilization. This involves placing a small coil or insert into each fallopian tube. Your doctor uses a technique called hysteroscopy to do the procedure. The device causes scar tissue to form. This results in permanent blockage of the fallopian   tubes, so the sperm cannot fertilize the egg. It takes about 3 months after the procedure for the tubes to become blocked. You must use another form of birth control for these 3 months.  Female sterilization. This is when the female has the tubes that carry sperm tied off (vasectomy).This blocks sperm from entering the vagina during sexual intercourse. After the procedure, the man can still ejaculate fluid (semen). Natural planning methods  Natural family planning. This is not having sexual intercourse or using a barrier method (condom, diaphragm, cervical cap) on days the woman could become pregnant.  Calendar method. This is keeping track of the length of each menstrual cycle and identifying when you are fertile.  Ovulation method. This is avoiding sexual intercourse during ovulation.  Symptothermal method. This is avoiding sexual intercourse during ovulation, using a thermometer and ovulation symptoms.  Post-ovulation method. This is timing sexual intercourse after you have ovulated. Regardless of which type or method of contraception you choose, it is important that you use condoms to protect against the transmission of sexually transmitted infections (STIs). Talk with your health care provider about which form of contraception is most appropriate for you. This information is not intended to replace advice given to you by your health care provider. Make sure you discuss any questions you have with your health care provider. Document Released:  01/31/2005 Document Revised: 07/09/2015 Document Reviewed: 07/26/2012 Elsevier Interactive Patient Education  2017 [employer].   Breastfeeding Deciding to breastfeed is one of the best choices you can make for you and your baby. A change in hormones during pregnancy causes your breast tissue to grow and increases the number and size of your milk ducts. These hormones also allow proteins, sugars, and fats from your blood supply to make breast milk in your milk-producing glands. Hormones prevent breast milk from being released before your baby is born as well as prompt milk flow after birth. Once breastfeeding has begun, thoughts of your baby, as well as his or her sucking or crying, can stimulate the release of milk from your milk-producing glands. Benefits of breastfeeding For Your Baby  Your first milk (colostrum) helps your baby's digestive system function better.  There are antibodies in your milk that help your baby fight off infections.  Your baby has a lower incidence of asthma, allergies, and sudden infant death syndrome.  The nutrients in breast milk are better for your baby than infant formulas and are designed uniquely for your baby's needs.  Breast milk improves your baby's brain development.  Your baby is less likely to develop other conditions, such as childhood obesity, asthma, or type 2 diabetes mellitus.  For You  Breastfeeding helps to create a very special bond between you and your baby.  Breastfeeding is convenient. Breast milk is always available at the correct temperature and costs nothing.  Breastfeeding helps to burn calories and helps you lose the weight gained during pregnancy.  Breastfeeding makes your uterus contract to its prepregnancy size faster and slows bleeding (lochia) after you give birth.  Breastfeeding helps to lower your risk of developing type 2 diabetes mellitus, osteoporosis, and breast or ovarian cancer later in life.  Signs that your baby  is hungry Early Signs of Hunger  Increased alertness or activity.  Stretching.  Movement of the head from side to side.  Movement of the head and opening of the mouth when the corner of the mouth or cheek is stroked (rooting).  Increased sucking sounds, smacking lips, cooing, sighing, or  squeaking.  Hand-to-mouth movements.  Increased sucking of fingers or hands.  Late Signs of Hunger  Fussing.  Intermittent crying.  Extreme Signs of Hunger Signs of extreme hunger will require calming and consoling before your baby will be able to breastfeed successfully. Do not wait for the following signs of extreme hunger to occur before you initiate breastfeeding:  Restlessness.  A loud, strong cry.  Screaming.  Breastfeeding basics Breastfeeding Initiation  Find a comfortable place to sit or lie down, with your neck and back well supported.  Place a pillow or rolled up blanket under your baby to bring him or her to the level of your breast (if you are seated). Nursing pillows are specially designed to help support your arms and your baby while you breastfeed.  Make sure that your baby's abdomen is facing your abdomen.  Gently massage your breast. With your fingertips, massage from your chest wall toward your nipple in a circular motion. This encourages milk flow. You may need to continue this action during the feeding if your milk flows slowly.  Support your breast with 4 fingers underneath and your thumb above your nipple. Make sure your fingers are well away from your nipple and your baby's mouth.  Stroke your baby's lips gently with your finger or nipple.  When your baby's mouth is open wide enough, quickly bring your baby to your breast, placing your entire nipple and as much of the colored area around your nipple (areola) as possible into your baby's mouth. ? More areola should be visible above your baby's upper lip than below the lower lip. ? Your baby's tongue should be  between his or her lower gum and your breast.  Ensure that your baby's mouth is correctly positioned around your nipple (latched). Your baby's lips should create a seal on your breast and be turned out (everted).  It is common for your baby to suck about 2-3 minutes in order to start the flow of breast milk.  Latching Teaching your baby how to latch on to your breast properly is very important. An improper latch can cause nipple pain and decreased milk supply for you and poor weight gain in your baby. Also, if your baby is not latched onto your nipple properly, he or she may swallow some air during feeding. This can make your baby fussy. Burping your baby when you switch breasts during the feeding can help to get rid of the air. However, teaching your baby to latch on properly is still the best way to prevent fussiness from swallowing air while breastfeeding. Signs that your baby has successfully latched on to your nipple:  Silent tugging or silent sucking, without causing you pain.  Swallowing heard between every 3-4 sucks.  Muscle movement above and in front of his or her ears while sucking.  Signs that your baby has not successfully latched on to nipple:  Sucking sounds or smacking sounds from your baby while breastfeeding.  Nipple pain.  If you think your baby has not latched on correctly, slip your finger into the corner of your baby's mouth to break the suction and place it between your baby's gums. Attempt breastfeeding initiation again. Signs of Successful Breastfeeding Signs from your baby:  A gradual decrease in the number of sucks or complete cessation of sucking.  Falling asleep.  Relaxation of his or her body.  Retention of a small amount of milk in his or her mouth.  Letting go of your breast by himself or herself.    Signs from you:  Breasts that have increased in firmness, weight, and size 1-3 hours after feeding.  Breasts that are softer immediately after  breastfeeding.  Increased milk volume, as well as a change in milk consistency and color by the fifth day of breastfeeding.  Nipples that are not sore, cracked, or bleeding.  Signs That Your Baby is Getting Enough Milk  Wetting at least 1-2 diapers during the first 24 hours after birth.  Wetting at least 5-6 diapers every 24 hours for the first week after birth. The urine should be clear or pale yellow by 5 days after birth.  Wetting 6-8 diapers every 24 hours as your baby continues to grow and develop.  At least 3 stools in a 24-hour period by age 5 days. The stool should be soft and yellow.  At least 3 stools in a 24-hour period by age 7 days. The stool should be seedy and yellow.  No loss of weight greater than 10% of birth weight during the first 3 days of age.  Average weight gain of 4-7 ounces (113-198 g) per week after age 4 days.  Consistent daily weight gain by age 5 days, without weight loss after the age of 2 weeks.  After a feeding, your baby may spit up a small amount. This is common. Breastfeeding frequency and duration Frequent feeding will help you make more milk and can prevent sore nipples and breast engorgement. Breastfeed when you feel the need to reduce the fullness of your breasts or when your baby shows signs of hunger. This is called "breastfeeding on demand." Avoid introducing a pacifier to your baby while you are working to establish breastfeeding (the first 4-6 weeks after your baby is born). After this time you may choose to use a pacifier. Research has shown that pacifier use during the first year of a baby's life decreases the risk of sudden infant death syndrome (SIDS). Allow your baby to feed on each breast as long as he or she wants. Breastfeed until your baby is finished feeding. When your baby unlatches or falls asleep while feeding from the first breast, offer the second breast. Because newborns are often sleepy in the first few weeks of life, you may  need to awaken your baby to get him or her to feed. Breastfeeding times will vary from baby to baby. However, the following rules can serve as a guide to help you ensure that your baby is properly fed:  Newborns (babies 4 weeks of age or younger) may breastfeed every 1-3 hours.  Newborns should not go longer than 3 hours during the day or 5 hours during the night without breastfeeding.  You should breastfeed your baby a minimum of 8 times in a 24-hour period until you begin to introduce solid foods to your baby at around 6 months of age.  Breast milk pumping Pumping and storing breast milk allows you to ensure that your baby is exclusively fed your breast milk, even at times when you are unable to breastfeed. This is especially important if you are going back to work while you are still breastfeeding or when you are not able to be present during feedings. Your lactation consultant can give you guidelines on how long it is safe to store breast milk. A breast pump is a machine that allows you to pump milk from your breast into a sterile bottle. The pumped breast milk can then be stored in a refrigerator or freezer. Some breast pumps are operated by   hand, while others use electricity. Ask your lactation consultant which type will work best for you. Breast pumps can be purchased, but some hospitals and breastfeeding support groups lease breast pumps on a monthly basis. A lactation consultant can teach you how to hand express breast milk, if you prefer not to use a pump. Caring for your breasts while you breastfeed Nipples can become dry, cracked, and sore while breastfeeding. The following recommendations can help keep your breasts moisturized and healthy:  Avoid using soap on your nipples.  Wear a supportive bra. Although not required, special nursing bras and tank tops are designed to allow access to your breasts for breastfeeding without taking off your entire bra or top. Avoid wearing  underwire-style bras or extremely tight bras.  Air dry your nipples for 3-4minutes after each feeding.  Use only cotton bra pads to absorb leaked breast milk. Leaking of breast milk between feedings is normal.  Use lanolin on your nipples after breastfeeding. Lanolin helps to maintain your skin's normal moisture barrier. If you use pure lanolin, you do not need to wash it off before feeding your baby again. Pure lanolin is not toxic to your baby. You may also hand express a few drops of breast milk and gently massage that milk into your nipples and allow the milk to air dry.  In the first few weeks after giving birth, some women experience extremely full breasts (engorgement). Engorgement can make your breasts feel heavy, warm, and tender to the touch. Engorgement peaks within 3-5 days after you give birth. The following recommendations can help ease engorgement:  Completely empty your breasts while breastfeeding or pumping. You may want to start by applying warm, moist heat (in the shower or with warm water-soaked hand towels) just before feeding or pumping. This increases circulation and helps the milk flow. If your baby does not completely empty your breasts while breastfeeding, pump any extra milk after he or she is finished.  Wear a snug bra (nursing or regular) or tank top for 1-2 days to signal your body to slightly decrease milk production.  Apply ice packs to your breasts, unless this is too uncomfortable for you.  Make sure that your baby is latched on and positioned properly while breastfeeding.  If engorgement persists after 48 hours of following these recommendations, contact your health care provider or a lactation consultant. Overall health care recommendations while breastfeeding  Eat healthy foods. Alternate between meals and snacks, eating 3 of each per day. Because what you eat affects your breast milk, some of the foods may make your baby more irritable than usual. Avoid  eating these foods if you are sure that they are negatively affecting your baby.  Drink milk, fruit juice, and water to satisfy your thirst (about 10 glasses a day).  Rest often, relax, and continue to take your prenatal vitamins to prevent fatigue, stress, and anemia.  Continue breast self-awareness checks.  Avoid chewing and smoking tobacco. Chemicals from cigarettes that pass into breast milk and exposure to secondhand smoke may harm your baby.  Avoid alcohol and drug use, including marijuana. Some medicines that may be harmful to your baby can pass through breast milk. It is important to ask your health care provider before taking any medicine, including all over-the-counter and prescription medicine as well as vitamin and herbal supplements. It is possible to become pregnant while breastfeeding. If birth control is desired, ask your health care provider about options that will be safe for your baby. Contact   a health care provider if:  You feel like you want to stop breastfeeding or have become frustrated with breastfeeding.  You have painful breasts or nipples.  Your nipples are cracked or bleeding.  Your breasts are red, tender, or warm.  You have a swollen area on either breast.  You have a fever or chills.  You have nausea or vomiting.  You have drainage other than breast milk from your nipples.  Your breasts do not become full before feedings by the fifth day after you give birth.  You feel sad and depressed.  Your baby is too sleepy to eat well.  Your baby is having trouble sleeping.  Your baby is wetting less than 3 diapers in a 24-hour period.  Your baby has less than 3 stools in a 24-hour period.  Your baby's skin or the white part of his or her eyes becomes yellow.  Your baby is not gaining weight by 225 days of age. Get help right away if:  Your baby is overly tired (lethargic) and does not want to wake up and feed.  Your baby develops an unexplained  fever. This information is not intended to replace advice given to you by your health care provider. Make sure you discuss any questions you have with your health care provider. Document Released: 01/31/2005 Document Revised: 07/15/2015 Document Reviewed: 07/25/2012 Elsevier Interactive Patient Education  2017 Elsevier Inc.  Vaginal Birth After Cesarean Delivery Vaginal birth after cesarean delivery (VBAC) is giving birth vaginally after previously delivering a baby by a cesarean. In the past, if a woman had a cesarean delivery, all births afterward would be done by cesarean delivery. This is no longer true. It can be safe for the mother to try a vaginal delivery after having a cesarean delivery. It is important to discuss VBAC with your health care provider early in the pregnancy so you can understand the risks, benefits, and options. It will give you time to decide what is best in your particular case. The final decision about whether to have a VBAC or repeat cesarean delivery should be between you and your health care provider. Any changes in your health or your baby's health during your pregnancy may make it necessary to change your initial decision about VBAC. Women who plan to have a VBAC should check with their health care provider to be sure that:  The previous cesarean delivery was done with a low transverse uterine cut (incision) (not a vertical classical incision).  The birth canal is big enough for the baby.  There were no other operations on the uterus.  An electronic fetal monitor (EFM) will be on at all times during labor.  An operating room will be available and ready in case an emergency cesarean delivery is needed.  A health care provider and surgical nursing staff will be available at all times during labor to be ready to do an emergency delivery cesarean if necessary.  An anesthesiologist will be present in case an emergency cesarean delivery is needed.  The nursery is  prepared and has adequate personnel and necessary equipment available to care for the baby in case of an emergency cesarean delivery. Benefits of VBAC  Shorter stay in the hospital.  Avoidance of risks associated with cesarean delivery, such as: ? Surgical complications, such as opening of the incision or hernia in the incision. ? Injury to other organs. ? Fever. This can occur if an infection develops after surgery. It can also occur as a reaction  to the medicine given to make you numb during the surgery.  Less blood loss and need for blood transfusions.  Lower risk of blood clots and infection.  Shorter recovery.  Decreased risk for having to remove the uterus (hysterectomy).  Decreased risk for the placenta to completely or partially cover the opening of the uterus (placenta previa) with a future pregnancy.  Decrease risk in future labor and delivery. Risks of a VBAC  Tearing (rupture) of the uterus. This is occurs in less than 1% of VBACs. The risk of this happening is higher if: ? Steps are taken to begin the labor process (induce labor) or stimulate or strengthen contractions (augment labor). ? Medicine is used to soften (ripen) the cervix.  Having to remove the uterus (hysterectomy) if it ruptures. VBAC should not be done if:  The previous cesarean delivery was done with a vertical (classical) or T-shaped incision or you do not know what kind of incision was made.  You had a ruptured uterus.  You have had certain types of surgery on your uterus, such as removal of uterine fibroids. Ask your health care provider about other types of surgeries that prevent you from having a VBAC.  You have certain medical or childbirth (obstetrical) problems.  There are problems with the baby.  You have had two previous cesarean deliveries and no vaginal deliveries. Other facts to know about VBAC:  It is safe to have an epidural anesthetic with VBAC.  It is safe to turn the baby from  a breech position (attempt an external cephalic version).  It is safe to try a VBAC with twins.  VBAC may not be successful if your baby weights 8.8 lb (4 kg) or more. However, weight predictions are not always accurate and should not be used alone to decide if VBAC is right for you.  There is an increased failure rate if the time between the cesarean delivery and VBAC is less than 19 months.  Your health care provider may advise against a VBAC if you have preeclampsia (high blood pressure, protein in the urine, and swelling of face and extremities).  VBAC is often successful if you previously gave birth vaginally.  VBAC is often successful when the labor starts spontaneously before the due date.  Delivering a baby through a VBAC is similar to having a normal spontaneous vaginal delivery. This information is not intended to replace advice given to you by your health care provider. Make sure you discuss any questions you have with your health care provider. Document Released: 07/24/2006 Document Revised: 07/09/2015 Document Reviewed: 08/30/2012 Elsevier Interactive Patient Education  [employer]2018 Elsevier Inc.

## 2017-01-27 NOTE — Progress Notes (Signed)
   Subjective:    Vicki [name] is a [id number] 6541w5d being seen today for her first obstetrical visit.  Her obstetrical history is significant for advanced maternal age and previous cesareaan section and currently on methadone. Patient does intend to breast feed. Pregnancy history fully reviewed.  Patient reports nausea.  Vitals:   01/27/17 0921 01/27/17 0924  BP: 108/76   Pulse: 97   Weight: 215 lb (97.5 kg)   Height:  5\' 7"  (1.702 m)    HISTORY: OB History  Gravida Para Term Preterm AB Living  8 1 1   6 1   SAB TAB Ectopic Multiple Live Births  5   1   1     # Outcome Date GA Lbr Len/2nd Weight Sex Delivery Anes PTL Lv  8 Current           7 Term 11/08/06 5761w0d   F CS-LTranv   LIV  6 Ectopic           5 SAB           4 SAB           3 SAB           2 SAB           1 SAB              Past Medical History:  Diagnosis Date  . Degenerative disc disease, lumbar    Past Surgical History:  Procedure Laterality Date  . CESAREAN SECTION    . CHOLECYSTECTOMY     Family History  Problem Relation Age of Onset  . Hypertension Mother   . Thyroid disease Father   . Hypertension Father   . Diabetes Father      Exam    Uterus:     Pelvic Exam:    Perineum: No Hemorrhoids, Normal Perineum   Vulva: normal   Vagina:  normal mucosa, normal discharge   pH:    Cervix: multiparous appearance   Adnexa: normal adnexa and no mass, fullness, tenderness   Bony Pelvis: gynecoid  System: Breast:  normal appearance, no masses or tenderness   Skin: normal coloration and turgor, no rashes    Neurologic: oriented, no focal deficits   Extremities: normal strength, tone, and muscle mass   HEENT extra ocular movement intact   Mouth/Teeth mucous membranes moist, pharynx normal without lesions and dental hygiene good   Neck supple and no masses   Cardiovascular: regular rate and rhythm   Respiratory:  chest clear, no wheezing, crepitations, rhonchi, normal symmetric air entry   Abdomen: soft, non-tender; bowel sounds normal; no masses,  no organomegaly   Urinary:       Assessment:    Pregnancy: [id number] Patient Active Problem List   Diagnosis Date Noted  . Supervision of other normal pregnancy, antepartum 01/27/2017  . Previous cesarean delivery, antepartum 01/27/2017  . H/O degenerative disc disease 01/27/2017        Plan:     Initial labs drawn. Prenatal vitamins. Problem list reviewed and updated. Genetic Screening discussed First Screen: ordered.  Ultrasound discussed; fetal survey: ordered.  Follow up in 4 weeks. 50% of 30 min visit spent on counseling and coordination of care.  Rx [facility] provided [facility] provided and topic to be discussed at a later visit Discussed risks of NAS associated with methadone usage during pregnancy- will need to coordinate a NICU tour   Thien Berka 01/27/2017

## 2017-01-27 NOTE — Progress Notes (Signed)
DATING AND VIABILITY SONOGRAM   Vicki GambleRobin T [name] is a 36 y.o. year old [phone] with LMP Patient's last menstrual period was 11/13/2016. which would correlate to  965w5d weeks gestation.  She has regular menstrual cycles.   She is here today for a confirmatory initial sonogram.    GESTATION: singleton   FETAL ACTIVITY:          Heart rate       180 bpm          The fetus is active.   ADNEXA: The ovaries are normal.   GESTATIONAL AGE AND  BIOMETRICS:  Gestational criteria: Estimated Date of Delivery: 08/20/17 by LMP now at 5165w5d  Previous Scans:0      CROWN RUMP LENGTH          mm         10-4 weeks                                                                               AVERAGE EGA(BY THIS SCAN):  6981w4d weeks  WORKING EDD( LMP ):  08-20-17     Vicki StammerJennifer Sybel [name] 01/27/2017 9:37 AM

## 2017-01-30 LAB — CULTURE, URINE COMPREHENSIVE

## 2017-02-01 ENCOUNTER — Other Ambulatory Visit: Payer: Self-pay | Admitting: Obstetrics and Gynecology

## 2017-02-01 LAB — CYTOLOGY - PAP
CHLAMYDIA, DNA PROBE: NEGATIVE
Diagnosis: NEGATIVE
HPV (WINDOPATH): NOT DETECTED
NEISSERIA GONORRHEA: NEGATIVE

## 2017-02-02 ENCOUNTER — Encounter (HOSPITAL_COMMUNITY): Payer: Self-pay | Admitting: Obstetrics and Gynecology

## 2017-02-13 ENCOUNTER — Encounter (HOSPITAL_COMMUNITY): Payer: Self-pay

## 2017-02-13 ENCOUNTER — Other Ambulatory Visit: Payer: Self-pay | Admitting: Obstetrics and Gynecology

## 2017-02-13 ENCOUNTER — Ambulatory Visit (HOSPITAL_COMMUNITY)
Admission: RE | Admit: 2017-02-13 | Discharge: 2017-02-13 | Disposition: A | Discharge status: Home / Self Care | Payer: Medicaid Other | Source: Ambulatory Visit | Attending: Obstetrics and Gynecology | Admitting: Obstetrics and Gynecology

## 2017-02-13 ENCOUNTER — Ambulatory Visit (HOSPITAL_COMMUNITY)
Admission: RE | Admit: 2017-02-13 | Discharge: 2017-02-13 | Disposition: A | Discharge status: Home / Self Care | Payer: Medicaid Other | Source: Ambulatory Visit | Attending: Obstetrics & Gynecology | Admitting: Obstetrics & Gynecology

## 2017-02-13 ENCOUNTER — Other Ambulatory Visit: Payer: Self-pay

## 2017-02-13 DIAGNOSIS — O99211 Obesity complicating pregnancy, first trimester: Secondary | ICD-10-CM

## 2017-02-13 DIAGNOSIS — Z3A13 13 weeks gestation of pregnancy: Secondary | ICD-10-CM

## 2017-02-13 DIAGNOSIS — Z3682 Encounter for antenatal screening for nuchal translucency: Secondary | ICD-10-CM

## 2017-02-13 DIAGNOSIS — Z3480 Encounter for supervision of other normal pregnancy, unspecified trimester: Secondary | ICD-10-CM

## 2017-02-13 DIAGNOSIS — O09529 Supervision of elderly multigravida, unspecified trimester: Secondary | ICD-10-CM

## 2017-02-13 DIAGNOSIS — O09521 Supervision of elderly multigravida, first trimester: Secondary | ICD-10-CM | POA: Insufficient documentation

## 2017-02-13 DIAGNOSIS — E669 Obesity, unspecified: Secondary | ICD-10-CM | POA: Diagnosis not present

## 2017-02-13 DIAGNOSIS — O99321 Drug use complicating pregnancy, first trimester: Secondary | ICD-10-CM

## 2017-02-13 DIAGNOSIS — Z3689 Encounter for other specified antenatal screening: Secondary | ICD-10-CM | POA: Diagnosis not present

## 2017-02-13 DIAGNOSIS — F1190 Opioid use, unspecified, uncomplicated: Secondary | ICD-10-CM | POA: Insufficient documentation

## 2017-02-13 DIAGNOSIS — O09891 Supervision of other high risk pregnancies, first trimester: Secondary | ICD-10-CM | POA: Diagnosis present

## 2017-02-13 HISTORY — DX: Dorsalgia, unspecified: M54.9

## 2017-02-13 NOTE — Progress Notes (Signed)
Genetic Counseling  High-Risk Gestation Note  Appointment Date:  02/13/2017 Referred By: Vicki [name], Peggy, MD Date of Birth:  1980/12/29 Partner:  Vicki Okaan   Pregnancy History: [id number] Estimated Date of Delivery: 08/20/17 Estimated Gestational Age: 7335w1d Attending: Particia NearingMartha Decker, MD   Vicki [name] and her partner, Vicki OkaDan, were seen for genetic counseling because of a maternal age of 36 y.o. per her request at today's visit. She will be 36 years old at delivery.     In summary:  Discussed AMA and associated risk for fetal aneuploidy  Discussed options for screening  First screen- declined  Quad screen- declined  NIPS- elected Panorama today  Ultrasound- see separate report  Discussed diagnostic testing options  [facility]S- declined  Amniocentesis- declined  Reviewed family history concerns  Father of the pregnancy reported he is carrier for beta thalassemia minor  Reviewed autosomal recessive inheritance of hemoglobinopathies  Vicki [name] has general population risk to be carrier for beta globin chain hemoglobinopathy  She elected to pursue carrier screening today via hemoglobin electrophoresis  Discussed carrier screening options  CF- declined  SMA- declined  Hemoglobinopathies- performed today  They were counseled regarding maternal age and the association with risk for chromosome conditions due to nondisjunction with aging of the ova.   We reviewed chromosomes, nondisjunction, and the associated 1 in 3987 risk for fetal aneuploidy at 2735w1d gestation related to a maternal age of 36 years old at delivery.  They were counseled that the risk for aneuploidy decreases as gestational age increases, accounting for those pregnancies which spontaneously abort.  We specifically discussed Down syndrome (trisomy 5421), trisomies 4513 and 4318, and sex chromosome aneuploidies (47,XXX and 47,XXY) including the common features and prognoses of each.   We reviewed available screening options  including First Screen, Quad screen, noninvasive prenatal screening (NIPS)/cell free DNA (cfDNA) screening, and detailed ultrasound.  They were counseled that screening tests are used to modify a patient's a priori risk for aneuploidy, typically based on age. This estimate provides a pregnancy specific risk assessment. We reviewed the benefits and limitations of each option. Specifically, we discussed the conditions for which each test screens, the detection rates, and false positive rates of each. They were also counseled regarding diagnostic testing via CVS and amniocentesis. We reviewed the approximate 1 in 300-500 risk for complications from amniocentesis, including spontaneous pregnancy loss. We discussed the possible results that the tests might provide including: positive, negative, unanticipated, and no result. Finally, they were counseled regarding the cost of each option and potential out of pocket expenses. After consideration of all the options, they elected to proceed with NIPS ([facility] through [facility]).  Those results will be available in 8-10 days.  They declined maternal serum screening and declined invasive testing (CVS and amniocentesis) at this time.   A nuchal translucency ultrasound was performed today and was within normal limits.  The report will be documented separately.  A detailed ultrasound is available to the patient at ~18+ weeks gestation. They understand that screening tests cannot rule out all birth defects or genetic syndromes. The patient was advised of this limitation and states she still does not want additional testing at this time.    Both family histories were reviewed and found to be contributory for beta thalassemia minor for the father of the pregnancy. We do not currently have medical documentation of the lab report for confirmation. He reported that his sister, his mother, and maternal grandmother are all also carriers of beta thalassemia minor. [name]  reported no known family history of beta thalassemia or other hemoglobinopathies, and consanguinity was denied for the couple.  Vicki [name] reported Northern European and Mediterranean ancestry. Vicki [name] reported [place] and [place] ancestry.   Vicki [name] was counseled that beta-thalassemia major is a genetic condition characterized by reduced synthesis of the beta subunit of hemoglobin (beta globin). Hemoglobin is a protein in red blood cells that carries oxygen to the body's organs. There are multiple types of hemoglobin, and these are comprised of different subunits.  The primary adult hemoglobin, denoted as hemoglobin A (Hb A) is made up of two alpha and two beta globin units. Individuals who have beta-thalassemia major have changes within the genes that code for beta globin. The resulting imbalance in the ratio of the alpha chains to the beta chains causes the underlying pathology. In beta-thalassemia, the alpha chains are produced in relative excess. Therefore, because of the absence of the beta chains with which to form a tetramer, the excess alpha chains will precipitate in the cell damaging the membrane and leading to premature RBC destruction. This results in hypochromic, microcytic anemia. The anemia is severe and typically leads to hepatosplenomegaly for individuals with beta thalassemia.  Because the beta chain is not used for hemoglobin production during fetal life, the onset of symptoms in beta-thalassemia is not until a few months of life. Beta thalassemia major is typically treated with regular transfusions and chelation therapy, aimed at reducing transfusion iron overload.  Without treatment, individuals with beta-thalassemia major have failure to thrive, feeding problems, and a shortened lifespan. Elevation of hemoglobin A2 (alpha2/delta2) occurs uniquely in beta-thalassemia carriers, and this is what is typically found on hemoglobin electrophoresis for beta thalassemia carriers. Continued  production of the delta chains allows for tetramer formation between the alpha and delta chains. Beta-thalassemia produces a variable phenotype dependent upon the severity of the mutations.    We reviewed that beta-thalassemia and other beta globin chain hemoglobinopathies are inherited in an autosomal recessive manner, and occurs when both copies of the beta hemoglobin gene are changed. Typically, one abnormal beta globin gene is inherited from each parent. We discussed that beta thalassemia trait can be inherited with other beta globin chain variants, such as sickle cell trait (Hb AS) to also cause other variant hemoglobinopathies, such as sickle beta-thalassemia. Beta-thalassemia minor occurs when an individual has one altered copy of the beta globin gene and one typical working copy of the beta globin gene.  This is also referred to as being a "carrier" of beta-thalassemia.  Carriers of recessive conditions typically do not have symptoms related to the condition, because they still have one functioning copy of the gene, and thus some production of the typical protein coded for by that gene.   Given the recessive inheritance, we discussed the importance of understanding the carrier status of Vicki [name] in order to accurately predict the risk of beta-thalassemia or other hemoglobinopathy in the current pregnancy. We discussed the option of hemoglobin electrophoresis with a complete blood count to assess for beta-thalassemia trait or any hemoglobin variant (including sickle cell trait). Prior to carrier screening, Vicki [name] would have the general population risk to be a carrier for a beta globin chain hemoglobinopathy. She understands that if she has typical hemoglobin, then the pregnancy would not be at risk to inherit a hemoglobinopathy but would have a 1 in 2 chance to have beta-thalassemia trait like the father of the pregnancy.    We discussed  that in the case that both parents are identified to  be carriers for hemoglobin variants, prenatal diagnosis via amniocentesis would be available, if desired. In the case of beta-thalassemia trait, molecular testing would first need to be performed to identify the causative mutation in the HBB gene prior to CVS or amniocentesis, given that >200 pathogenic variants have been identified in the HBB gene. Hemoglobin S (or sickle cell trait) is encoded by a specific point mutation (Glu6Val) in the HBB gene.  HBB gene sequencing is available to the father of the pregnancy to assess for the causative gene change for her beta thalassemia trait. HBB sequencing reportedly identifies a causative mutation in 99% of individuals with beta thalassemia.  The couple understands that targeted ultrasound in pregnancy would not be expected to see physical differences related to the presence or absence of a hemoglobinopathy. We also reviewed the availability of newborn screening in [place] for hemoglobinopathies. After careful consideration, Vicki [name] elected to pursue screening for hemoglobinopathies via hemoglobin electrophoresis at the time of today's visit.   Additionally, Vicki [name] reported a history of an ectopic pregnancy and five first trimester pregnancy losses with a previous partner. She reported that the losses were attributed to the uterine shape. Without further information regarding the provided family history, an accurate genetic risk cannot be calculated. Further genetic counseling is warranted if more information is obtained.  Ms. Augusto GambleRobin T [name] was provided with information regarding cystic fibrosis (CF), spinal muscular atrophy (SMA) and hemoglobinopathies including the carrier frequency, availability of carrier screening and prenatal diagnosis if indicated.  In addition, we discussed that CF and hemoglobinopathies are routinely screened for as part of the [facility] newborn screening panel. SMA is not currently on routine NBS panel but is now available in [place] on newborn screening under a research protocol for patients who enroll in Early Check ([email or web address].org). After further discussion, she declined screening for CF and SMA.  I counseled this couple regarding the above risks and available options.  The approximate face-to-face time with the genetic counselor was 35 minutes.  Quinn PlowmanKaren Deanglo Hissong, MS,  Certified Genetic Counselor 02/13/2017

## 2017-02-15 LAB — HEMOGLOBINOPATHY EVALUATION
HGB C: 0 %
HGB F QUANT: 0 % (ref 0.0–2.0)
HGB S QUANTITAION: 0 %
HGB VARIANT: 0 %
Hgb A2 Quant: 2.4 % (ref 1.8–3.2)
Hgb A: 97.6 % (ref 96.4–98.8)

## 2017-02-18 ENCOUNTER — Other Ambulatory Visit (HOSPITAL_COMMUNITY): Payer: Self-pay

## 2017-02-20 ENCOUNTER — Telehealth (HOSPITAL_COMMUNITY): Payer: Self-pay | Admitting: MS"

## 2017-02-20 NOTE — Telephone Encounter (Signed)
Called Vicki Gambleobin T Ewbank to discuss her prenatal cell free DNA test results.  Ms. Vicki [name] had Panorama testing through [facility].  Testing was offered because of maternal age.   The patient was identified by name and DOB.  We reviewed that these are within normal limits, showing a less than 1 in 10,000 risk for trisomies 21, 18 and 13, and monosomy X (Turner syndrome).  In addition, the risk for triploidy and sex chromosome trisomies (47,XXX and 47,XXY) was also low risk.  We reviewed that this testing identifies > 99% of pregnancies with trisomy 7021, trisomy 7313, sex chromosome trisomies (47,XXX and 47,XXY), and triploidy. The detection rate for trisomy 18 is 96%.  The detection rate for monosomy X is ~92%.  The false positive rate is <0.1% for all conditions. Testing was also consistent with female fetal sex.  The patient did wish to know fetal sex.  She understands that this testing does not identify all genetic conditions.    Additionally, hemoglobin electrophoresis was within normal limits. All questions were answered to her satisfaction, she was encouraged to call with additional questions or concerns.  Vicki PlowmanKaren Rishi Vicario, MS Certified Genetic Counselor 02/20/2017 9:47 AM

## 2017-02-24 ENCOUNTER — Encounter: Payer: Medicaid Other | Admitting: Obstetrics & Gynecology

## 2017-02-28 ENCOUNTER — Encounter: Payer: Medicaid Other | Admitting: Obstetrics and Gynecology

## 2017-03-01 ENCOUNTER — Encounter: Payer: Self-pay | Admitting: Obstetrics and Gynecology

## 2017-03-01 ENCOUNTER — Ambulatory Visit (INDEPENDENT_AMBULATORY_CARE_PROVIDER_SITE_OTHER): Payer: Medicaid Other | Admitting: Obstetrics and Gynecology

## 2017-03-01 ENCOUNTER — Encounter: Payer: Self-pay | Admitting: Radiology

## 2017-03-01 VITALS — BP 115/73 | HR 72 | Wt 213.0 lb

## 2017-03-01 DIAGNOSIS — O99210 Obesity complicating pregnancy, unspecified trimester: Secondary | ICD-10-CM | POA: Insufficient documentation

## 2017-03-01 DIAGNOSIS — F1120 Opioid dependence, uncomplicated: Secondary | ICD-10-CM

## 2017-03-01 DIAGNOSIS — E669 Obesity, unspecified: Secondary | ICD-10-CM | POA: Insufficient documentation

## 2017-03-01 DIAGNOSIS — O0992 Supervision of high risk pregnancy, unspecified, second trimester: Secondary | ICD-10-CM

## 2017-03-01 NOTE — Progress Notes (Signed)
Last pap 01/27/2017

## 2017-03-01 NOTE — Progress Notes (Signed)
Prenatal Visit Note Date: 03/01/2017 Clinic: [facility]  Subjective:  Vicki [name] is a 37 y.o. [phone] at 7033w3d being seen today for ongoing prenatal care.  She is currently monitored for the following issues for this high-risk pregnancy and has Supervision of high risk pregnancy, antepartum, second trimester; Previous cesarean delivery, antepartum; H/O degenerative disc disease; AMA (advanced maternal age) multigravida 35+; Obesity (BMI 30-39.9); and Obesity in pregnancy on their problem list.  Patient reports no complaints.   Contractions: Not present. Vag. Bleeding: None.  Movement: Absent. Denies leaking of fluid.   The following portions of the patient's history were reviewed and updated as appropriate: allergies, current medications, past family history, past medical history, past social history, past surgical history and problem list. Problem list updated.  Objective:   Vitals:   03/01/17 0930  BP: 115/73  Pulse: 72  Weight: 213 lb (96.6 kg)    Fetal Status: Fetal Heart Rate (bpm): 151   Movement: Absent     General:  Alert, oriented and cooperative. Patient is in no acute distress.  Skin: Skin is warm and dry. No rash noted.   Cardiovascular: Normal heart rate noted  Respiratory: Normal respiratory effort, no problems with respiration noted  Abdomen: Soft, gravid, appropriate for gestational age. Pain/Pressure: Absent     Pelvic:  Cervical exam deferred        Extremities: Normal range of motion.  Edema: None  Mental Status: Normal mood and affect. Normal behavior. Normal judgment and thought content.   Urinalysis:      Assessment and Plan:  Pregnancy: [id number] at 7333w3d  1. Supervision of high risk pregnancy, antepartum, second trimester Routine care. Anatomy u/s to be scheduled for 3-4wks. To sign roi to get morehead c-section op note. S/p GC.  - Obstetric Panel, Including HIV - Hemoglobin A1c - CMP and Liver - Protein / creatinine ratio,  urine - AFP, Serum, Open Spina Bifida - TSH  2. On Methadone Currently on 40qday. D/w re: Vicki [name]. Schedule nicu tour in third trimester  Preterm labor symptoms and general obstetric precautions including but not limited to vaginal bleeding, contractions, leaking of fluid and fetal movement were reviewed in detail with the patient. Please refer to After Visit Summary for other counseling recommendations.  Return in about 1 month (around 04/01/2017) for rob.   [place] BingPickens, Meggie Laseter, MD

## 2017-03-02 ENCOUNTER — Other Ambulatory Visit: Payer: Self-pay | Admitting: Obstetrics and Gynecology

## 2017-03-02 ENCOUNTER — Encounter: Payer: Self-pay | Admitting: Obstetrics and Gynecology

## 2017-03-02 DIAGNOSIS — O1210 Gestational proteinuria, unspecified trimester: Secondary | ICD-10-CM | POA: Insufficient documentation

## 2017-03-02 LAB — PROTEIN / CREATININE RATIO, URINE
CREATININE, UR: 20.8 mg/dL
PROTEIN/CREAT RATIO: 837 mg/g{creat} — AB (ref 0–200)
Protein, Ur: 17.4 mg/dL

## 2017-03-04 LAB — CMP AND LIVER
ALK PHOS: 44 IU/L (ref 39–117)
ALT: 16 IU/L (ref 0–32)
AST: 15 IU/L (ref 0–40)
Albumin: 4.3 g/dL (ref 3.5–5.5)
BUN: 5 mg/dL — AB (ref 6–20)
Bilirubin Total: 1 mg/dL (ref 0.0–1.2)
Bilirubin, Direct: 0.25 mg/dL (ref 0.00–0.40)
CALCIUM: 9.7 mg/dL (ref 8.7–10.2)
CO2: 21 mmol/L (ref 20–29)
CREATININE: 0.59 mg/dL (ref 0.57–1.00)
Chloride: 97 mmol/L (ref 96–106)
GFR calc Af Amer: 137 mL/min/{1.73_m2} (ref >59)
GFR, EST NON AFRICAN AMERICAN: 119 mL/min/{1.73_m2} (ref >59)
GLUCOSE: 99 mg/dL (ref 65–99)
Potassium: 3.6 mmol/L (ref 3.5–5.2)
Sodium: 133 mmol/L — ABNORMAL LOW (ref 134–144)
TOTAL PROTEIN: 6.8 g/dL (ref 6.0–8.5)

## 2017-03-04 LAB — OBSTETRIC PANEL, INCLUDING HIV
Antibody Screen: NEGATIVE
BASOS ABS: 0 10*3/uL (ref 0.0–0.2)
Basos: 0 %
EOS (ABSOLUTE): 0.3 10*3/uL (ref 0.0–0.4)
Eos: 3 %
HEP B S AG: NEGATIVE
HIV SCREEN 4TH GENERATION: NONREACTIVE
Hematocrit: 39.2 % (ref 34.0–46.6)
Hemoglobin: 13 g/dL (ref 11.1–15.9)
IMMATURE GRANS (ABS): 0 10*3/uL (ref 0.0–0.1)
IMMATURE GRANULOCYTES: 0 %
LYMPHS: 18 %
Lymphocytes Absolute: 1.5 10*3/uL (ref 0.7–3.1)
MCH: 27.6 pg (ref 26.6–33.0)
MCHC: 33.2 g/dL (ref 31.5–35.7)
MCV: 83 fL (ref 79–97)
MONOCYTES: 4 %
Monocytes Absolute: 0.4 10*3/uL (ref 0.1–0.9)
NEUTROS PCT: 75 %
Neutrophils Absolute: 6.2 10*3/uL (ref 1.4–7.0)
Platelets: 243 10*3/uL (ref 150–379)
RBC: 4.71 x10E6/uL (ref 3.77–5.28)
RDW: 15.4 % (ref 12.3–15.4)
RPR: NONREACTIVE
RUBELLA: 2.46 {index} (ref >0.99)
Rh Factor: POSITIVE
WBC: 8.4 10*3/uL (ref 3.4–10.8)

## 2017-03-04 LAB — AFP, SERUM, OPEN SPINA BIFIDA
AFP MoM: 2.41
AFP Value: 54.4 ng/mL
Gest. Age on Collection Date: 15.3 weeks
Maternal Age At EDD: 36.4 yr
OSBR RISK 1 IN: 342
Test Results:: NEGATIVE
WEIGHT: 213 [lb_av]

## 2017-03-04 LAB — HEMOGLOBIN A1C
ESTIMATED AVERAGE GLUCOSE: 100 mg/dL
HEMOGLOBIN A1C: 5.1 % (ref 4.8–5.6)

## 2017-03-04 LAB — TSH: TSH: 0.44 u[IU]/mL — AB (ref 0.450–4.500)

## 2017-03-06 ENCOUNTER — Encounter: Payer: Self-pay | Admitting: Obstetrics and Gynecology

## 2017-03-06 DIAGNOSIS — E0590 Thyrotoxicosis, unspecified without thyrotoxic crisis or storm: Secondary | ICD-10-CM | POA: Insufficient documentation

## 2017-03-06 HISTORY — DX: Thyrotoxicosis, unspecified without thyrotoxic crisis or storm: E05.90

## 2017-03-07 ENCOUNTER — Telehealth: Payer: Self-pay

## 2017-03-07 NOTE — Telephone Encounter (Signed)
-----   Message from Lindell SparHeather L Bacon, [place] sent at 03/07/2017  3:22 PM EST ----- Regarding: lab results Contact: [phone] patient would like lab results

## 2017-03-07 NOTE — Telephone Encounter (Signed)
Call patient in regards to lab result explained to patient results are not back yet and we will call once they come back in we will call her back regarding results.

## 2017-03-08 ENCOUNTER — Encounter: Payer: Self-pay | Admitting: Obstetrics and Gynecology

## 2017-03-08 LAB — SPECIMEN STATUS REPORT

## 2017-03-08 LAB — T4, FREE: FREE T4: 1.26 ng/dL (ref 0.82–1.77)

## 2017-03-13 ENCOUNTER — Telehealth: Payer: Self-pay | Admitting: *Deleted

## 2017-03-13 NOTE — Telephone Encounter (Signed)
-----   Message from Lindell SparHeather L Bacon, [place] sent at 03/13/2017  4:04 PM EST ----- Regarding: lab results Contact: [phone] Patient would like call back for results from T3-T4 blood draw

## 2017-03-13 NOTE — Telephone Encounter (Signed)
Pt aware of results and will sign up for mychart

## 2017-03-13 NOTE — Telephone Encounter (Signed)
-----   Message from Heather L Bacon, NT sent at 03/13/2017  4:04 PM EST ----- Regarding: lab results Contact: [phone] Patient would like call back for results from T3-T4 blood draw 

## 2017-03-28 ENCOUNTER — Ambulatory Visit (HOSPITAL_COMMUNITY)
Admission: RE | Admit: 2017-03-28 | Discharge: 2017-03-28 | Disposition: A | Discharge status: Home / Self Care | Payer: Medicaid Other | Source: Ambulatory Visit | Attending: Obstetrics and Gynecology | Admitting: Obstetrics and Gynecology

## 2017-03-29 ENCOUNTER — Encounter: Payer: Medicaid Other | Admitting: Family Medicine

## 2017-04-03 ENCOUNTER — Encounter: Payer: Medicaid Other | Admitting: Family Medicine

## 2017-04-04 ENCOUNTER — Other Ambulatory Visit: Payer: Self-pay | Admitting: Obstetrics and Gynecology

## 2017-04-04 ENCOUNTER — Ambulatory Visit (HOSPITAL_COMMUNITY)
Admission: RE | Admit: 2017-04-04 | Discharge: 2017-04-04 | Disposition: A | Discharge status: Home / Self Care | Payer: Medicaid Other | Source: Ambulatory Visit | Attending: Obstetrics and Gynecology | Admitting: Obstetrics and Gynecology

## 2017-04-04 ENCOUNTER — Encounter (HOSPITAL_COMMUNITY): Payer: Self-pay

## 2017-04-04 DIAGNOSIS — Z3689 Encounter for other specified antenatal screening: Secondary | ICD-10-CM

## 2017-04-04 DIAGNOSIS — O99322 Drug use complicating pregnancy, second trimester: Secondary | ICD-10-CM | POA: Insufficient documentation

## 2017-04-04 DIAGNOSIS — F1120 Opioid dependence, uncomplicated: Secondary | ICD-10-CM | POA: Insufficient documentation

## 2017-04-04 DIAGNOSIS — Z3A20 20 weeks gestation of pregnancy: Secondary | ICD-10-CM | POA: Diagnosis not present

## 2017-04-04 DIAGNOSIS — O0992 Supervision of high risk pregnancy, unspecified, second trimester: Secondary | ICD-10-CM

## 2017-04-04 DIAGNOSIS — Z3480 Encounter for supervision of other normal pregnancy, unspecified trimester: Secondary | ICD-10-CM

## 2017-04-04 DIAGNOSIS — O99212 Obesity complicating pregnancy, second trimester: Secondary | ICD-10-CM | POA: Insufficient documentation

## 2017-04-04 DIAGNOSIS — O09522 Supervision of elderly multigravida, second trimester: Secondary | ICD-10-CM | POA: Insufficient documentation

## 2017-04-04 DIAGNOSIS — E669 Obesity, unspecified: Secondary | ICD-10-CM | POA: Diagnosis not present

## 2017-04-04 DIAGNOSIS — O34219 Maternal care for unspecified type scar from previous cesarean delivery: Secondary | ICD-10-CM | POA: Insufficient documentation

## 2017-04-04 DIAGNOSIS — O09529 Supervision of elderly multigravida, unspecified trimester: Secondary | ICD-10-CM

## 2017-04-05 ENCOUNTER — Other Ambulatory Visit (HOSPITAL_COMMUNITY): Payer: Self-pay | Admitting: *Deleted

## 2017-04-05 DIAGNOSIS — F1120 Opioid dependence, uncomplicated: Secondary | ICD-10-CM

## 2017-04-10 ENCOUNTER — Other Ambulatory Visit: Payer: Self-pay

## 2017-04-10 ENCOUNTER — Encounter: Payer: Medicaid Other | Admitting: Obstetrics & Gynecology

## 2017-04-10 MED ORDER — PRENATAL VITAMINS 0.8 MG PO TABS: 1.0000 | ORAL_TABLET | Freq: Every day | ORAL | 5 refills | Status: DC

## 2017-04-10 NOTE — Telephone Encounter (Signed)
Refill on prenatal vitamins  ?

## 2017-05-01 ENCOUNTER — Ambulatory Visit (INDEPENDENT_AMBULATORY_CARE_PROVIDER_SITE_OTHER): Payer: Medicaid Other | Admitting: Obstetrics and Gynecology

## 2017-05-01 ENCOUNTER — Encounter: Payer: Self-pay | Admitting: Obstetrics and Gynecology

## 2017-05-01 VITALS — BP 104/70 | HR 103 | Wt 224.0 lb

## 2017-05-01 DIAGNOSIS — E669 Obesity, unspecified: Secondary | ICD-10-CM

## 2017-05-01 DIAGNOSIS — F1120 Opioid dependence, uncomplicated: Secondary | ICD-10-CM

## 2017-05-01 DIAGNOSIS — O09522 Supervision of elderly multigravida, second trimester: Secondary | ICD-10-CM

## 2017-05-01 DIAGNOSIS — O99210 Obesity complicating pregnancy, unspecified trimester: Secondary | ICD-10-CM

## 2017-05-01 DIAGNOSIS — O0992 Supervision of high risk pregnancy, unspecified, second trimester: Secondary | ICD-10-CM

## 2017-05-01 DIAGNOSIS — O34219 Maternal care for unspecified type scar from previous cesarean delivery: Secondary | ICD-10-CM

## 2017-05-01 DIAGNOSIS — O1212 Gestational proteinuria, second trimester: Secondary | ICD-10-CM

## 2017-05-01 DIAGNOSIS — O1210 Gestational proteinuria, unspecified trimester: Secondary | ICD-10-CM

## 2017-05-01 DIAGNOSIS — E0590 Thyrotoxicosis, unspecified without thyrotoxic crisis or storm: Secondary | ICD-10-CM

## 2017-05-01 NOTE — Progress Notes (Signed)
Prenatal Visit Note Date: 05/01/2017 [facility]  Subjective:  Vicki [name] is a 37 y.o. [phone] at 6929w1d being seen today for ongoing prenatal care.  She is currently monitored for the following issues for this high-risk pregnancy and has Supervision of high risk pregnancy, antepartum, second trimester; Previous cesarean delivery, antepartum; H/O degenerative disc disease; AMA (advanced maternal age) multigravida 35+; Obesity (BMI 30-39.9); Obesity in pregnancy; Methadone maintenance therapy patient (HCC); Proteinuria in pregnancy, antepartum; and Subclinical hyperthyroidism on their problem list.  Patient reports no complaints.   Contractions: Not present. Vag. Bleeding: None.  Movement: Present. Denies leaking of fluid.   The following portions of the patient's history were reviewed and updated as appropriate: allergies, current medications, past family history, past medical history, past social history, past surgical history and problem list. Problem list updated.  Objective:   Vitals:   05/01/17 1603  BP: 104/70  Pulse: (!) 103  Weight: 224 lb (101.6 kg)    Fetal Status: Fetal Heart Rate (bpm): 138   Movement: Present     General:  Alert, oriented and cooperative. Patient is in no acute distress.  Skin: Skin is warm and dry. No rash noted.   Cardiovascular: Normal heart rate noted  Respiratory: Normal respiratory effort, no problems with respiration noted  Abdomen: Soft, gravid, appropriate for gestational age. Pain/Pressure: Absent     Pelvic:  Cervical exam deferred        Extremities: Normal range of motion.  Edema: None  Mental Status: Normal mood and affect. Normal behavior. Normal judgment and thought content.   Urinalysis:      Assessment and Plan:  Pregnancy: [id number] at 5329w1d  1. Supervision of high risk pregnancy, antepartum, second trimester Routine care. 28wk labs nv  2. Methadone maintenance therapy patient (HCC) On 40  qday and about 20 percocet qday. Pt wondering if okay to come off medications and I told her I don't recommend this and that sometimes she has to go up based on increased blood volume, so if she feels that meds aren't working as effectively to let her doctor know so they may have to go up. Also d/w her re: NAAS and that newborn will have to stay after her d/c to watch for s/s. Set up NICU tour at her nv  3. Obesity in pregnancy Weight gain good  4. Obesity (BMI 30-39.9)  5. Elderly multigravida in second trimester No current issues  6. Previous cesarean delivery, antepartum D/w pt re: delivery mode nv  7. Subclinical hyperthyroidism ft4 with 28wk labs  8. Proteinuria in pregnancy, antepartum Instructions given - Protein, urine, 24 hour; Future - Creatinine, urine, 24 hour; Future  Preterm labor symptoms and general obstetric precautions including but not limited to vaginal bleeding, contractions, leaking of fluid and fetal movement were reviewed in detail with the patient. Please refer to After Visit Summary for other counseling recommendations.  Return in about 3 weeks (around 05/22/2017).   [place] BingPickens, Laureano Hetzer, MD

## 2017-05-04 ENCOUNTER — Other Ambulatory Visit: Payer: Self-pay

## 2017-05-04 DIAGNOSIS — O1210 Gestational proteinuria, unspecified trimester: Secondary | ICD-10-CM

## 2017-05-04 DIAGNOSIS — O0992 Supervision of high risk pregnancy, unspecified, second trimester: Secondary | ICD-10-CM

## 2017-05-04 NOTE — Addendum Note (Signed)
Addended by: Dianah FieldBELIZONE, Katalea Ucci L on: 05/04/2017 09:42 AM   Modules accepted: Orders

## 2017-05-05 ENCOUNTER — Telehealth: Payer: Self-pay

## 2017-05-05 DIAGNOSIS — O1212 Gestational proteinuria, second trimester: Secondary | ICD-10-CM

## 2017-05-05 NOTE — Addendum Note (Signed)
Addended by: Dianah FieldBELIZONE, Cleotha Tsang L on: 05/05/2017 12:00 PM   Modules accepted: Orders

## 2017-05-05 NOTE — Telephone Encounter (Signed)
Lab codes enter in

## 2017-05-06 LAB — CREATININE, URINE, 24 HOUR
Creatinine, 24H Ur: 621 mg/24 hr — ABNORMAL LOW (ref 800–1800)
Creatinine, Urine: 49.7 mg/dL

## 2017-05-06 LAB — PROTEIN, URINE, 24 HOUR
PROTEIN 24H UR: 308 mg/(24.h) — AB (ref 30–150)
PROTEIN UR: 24.6 mg/dL

## 2017-05-16 ENCOUNTER — Ambulatory Visit (HOSPITAL_COMMUNITY)
Admission: RE | Admit: 2017-05-16 | Discharge: 2017-05-16 | Disposition: A | Discharge status: Home / Self Care | Payer: Medicaid Other | Source: Ambulatory Visit | Attending: Obstetrics and Gynecology | Admitting: Obstetrics and Gynecology

## 2017-05-18 ENCOUNTER — Ambulatory Visit (HOSPITAL_COMMUNITY)
Admission: RE | Admit: 2017-05-18 | Discharge: 2017-05-18 | Disposition: A | Discharge status: Home / Self Care | Payer: Medicaid Other | Source: Ambulatory Visit | Attending: Obstetrics and Gynecology | Admitting: Obstetrics and Gynecology

## 2017-05-18 ENCOUNTER — Other Ambulatory Visit (HOSPITAL_COMMUNITY): Payer: Self-pay | Admitting: Obstetrics and Gynecology

## 2017-05-18 ENCOUNTER — Other Ambulatory Visit (HOSPITAL_COMMUNITY): Payer: Self-pay | Admitting: *Deleted

## 2017-05-18 ENCOUNTER — Encounter (HOSPITAL_COMMUNITY): Payer: Self-pay

## 2017-05-18 DIAGNOSIS — Z3A26 26 weeks gestation of pregnancy: Secondary | ICD-10-CM | POA: Insufficient documentation

## 2017-05-18 DIAGNOSIS — O99322 Drug use complicating pregnancy, second trimester: Secondary | ICD-10-CM | POA: Insufficient documentation

## 2017-05-18 DIAGNOSIS — O09522 Supervision of elderly multigravida, second trimester: Secondary | ICD-10-CM | POA: Insufficient documentation

## 2017-05-18 DIAGNOSIS — F1120 Opioid dependence, uncomplicated: Secondary | ICD-10-CM

## 2017-05-18 DIAGNOSIS — O99212 Obesity complicating pregnancy, second trimester: Secondary | ICD-10-CM | POA: Insufficient documentation

## 2017-05-18 DIAGNOSIS — O99320 Drug use complicating pregnancy, unspecified trimester: Principal | ICD-10-CM

## 2017-05-18 DIAGNOSIS — F1910 Other psychoactive substance abuse, uncomplicated: Secondary | ICD-10-CM | POA: Diagnosis not present

## 2017-05-18 DIAGNOSIS — O09523 Supervision of elderly multigravida, third trimester: Secondary | ICD-10-CM

## 2017-05-18 DIAGNOSIS — O34219 Maternal care for unspecified type scar from previous cesarean delivery: Secondary | ICD-10-CM | POA: Diagnosis not present

## 2017-05-18 NOTE — ED Notes (Signed)
Pt reports RUQ pain that started last night, denies headache or blurred vision, BP 115/74.  She feels it could be a pulled muscle from job related activity.  Instructed to call her office if the pain worsened.

## 2017-05-22 ENCOUNTER — Telehealth: Payer: Self-pay

## 2017-05-22 NOTE — Telephone Encounter (Signed)
Return patient call to discuss concern no answer or voice mail to leave a message.

## 2017-05-22 NOTE — Telephone Encounter (Signed)
-----   Message from Lindell SparHeather L Bacon, [place] sent at 05/18/2017 10:55 AM EDT ----- Regarding: patient has question Contact: [phone] Please call patient she has a question she would like to ask

## 2017-05-29 ENCOUNTER — Encounter: Payer: Medicaid Other | Admitting: Obstetrics and Gynecology

## 2017-05-31 ENCOUNTER — Ambulatory Visit (INDEPENDENT_AMBULATORY_CARE_PROVIDER_SITE_OTHER): Payer: Medicaid Other | Admitting: Family Medicine

## 2017-05-31 VITALS — BP 108/69 | HR 100 | Wt 218.4 lb

## 2017-05-31 DIAGNOSIS — O99210 Obesity complicating pregnancy, unspecified trimester: Secondary | ICD-10-CM

## 2017-05-31 DIAGNOSIS — E0590 Thyrotoxicosis, unspecified without thyrotoxic crisis or storm: Secondary | ICD-10-CM

## 2017-05-31 DIAGNOSIS — Z3402 Encounter for supervision of normal first pregnancy, second trimester: Secondary | ICD-10-CM

## 2017-05-31 DIAGNOSIS — O1210 Gestational proteinuria, unspecified trimester: Secondary | ICD-10-CM

## 2017-05-31 DIAGNOSIS — O34219 Maternal care for unspecified type scar from previous cesarean delivery: Secondary | ICD-10-CM

## 2017-05-31 DIAGNOSIS — O0992 Supervision of high risk pregnancy, unspecified, second trimester: Secondary | ICD-10-CM

## 2017-05-31 DIAGNOSIS — O09523 Supervision of elderly multigravida, third trimester: Secondary | ICD-10-CM

## 2017-05-31 DIAGNOSIS — F1120 Opioid dependence, uncomplicated: Secondary | ICD-10-CM

## 2017-05-31 DIAGNOSIS — O219 Vomiting of pregnancy, unspecified: Secondary | ICD-10-CM

## 2017-05-31 MED ORDER — DOXYLAMINE-PYRIDOXINE 10-10 MG PO TBEC: 2.0000 | DELAYED_RELEASE_TABLET | Freq: Every day | ORAL | 5 refills | Status: DC

## 2017-05-31 NOTE — Progress Notes (Signed)
   PRENATAL VISIT NOTE  Subjective:  Vicki [name] is a 37 y.o. [phone] at 1871w3d being seen today for ongoing prenatal care.  She is currently monitored for the following issues for this high-risk pregnancy and has Supervision of high risk pregnancy, antepartum, second trimester; Previous cesarean delivery, antepartum; H/O degenerative disc disease; AMA (advanced maternal age) multigravida 35+; Obesity (BMI 30-39.9); Obesity in pregnancy; Methadone maintenance therapy patient (HCC); Proteinuria in pregnancy, antepartum; and Subclinical hyperthyroidism on their problem list.  Patient reports backache.  Contractions: Not present. Vag. Bleeding: None.  Movement: Present. Denies leaking of fluid.   The following portions of the patient's history were reviewed and updated as appropriate: allergies, current medications, past family history, past medical history, past social history, past surgical history and problem list. Problem list updated.  Objective:   Vitals:   05/31/17 0917  BP: 108/69  Pulse: 100  Weight: 218 lb 6.4 oz (99.1 kg)    Fetal Status: Fetal Heart Rate (bpm): 133 Fundal Height: 28 cm Movement: Present     General:  Alert, oriented and cooperative. Patient is in no acute distress.  Skin: Skin is warm and dry. No rash noted.   Cardiovascular: Normal heart rate noted  Respiratory: Normal respiratory effort, no problems with respiration noted  Abdomen: Soft, gravid, appropriate for gestational age.  Pain/Pressure: Absent     Pelvic: Cervical exam deferred        Extremities: Normal range of motion.  Edema: None  Mental Status: Normal mood and affect. Normal behavior. Normal judgment and thought content.   Assessment and Plan:  Pregnancy: [id number] at 471w3d  1. Encounter for supervision of normal first pregnancy in second trimester - UTD currently  - RPR; Future - CBC; Future - HIV antibody; Future - Glucose Tolerance, 2 Hours w/1 Hour; Future - Glucose Tolerance, 2 Hours  w/1 Hour - HIV antibody - CBC - RPR  2. Subclinical hyperthyroidism Stable per last check  3. Proteinuria in pregnancy, antepartum Monitor  4. Supervision of high risk pregnancy, antepartum, second trimester UTD currently Reviewed with patient that she has nausea and recommended Zantac given weight loss as nausea was patient's reason for not eating as frequently.   5. Previous cesarean delivery, antepartum Discussed TOLAC today, currently undecided pLTCS- arrest of dilation at 8.5cm with chorioamnionitis and thick pea soup mec, labored > 24(CS called 1 hr after 8.5cm check)  6. Multigravida of advanced maternal age in third trimester  7. Methadone maintenance therapy patient [facility](HCC) Planning on NICU tour at next US  8. Obesity in pregnancy TWG= -16 lb 9.6 oz (-7.53 kg) -- Patient is losing weight in pregnancy (currently down 6lbs from prior). Recommended increasing calorie intake slightly and Zantac for GERD  Preterm labor symptoms and general obstetric precautions including but not limited to vaginal bleeding, contractions, leaking of fluid and fetal movement were reviewed in detail with the patient. Please refer to After Visit Summary for other counseling recommendations.  Return in about 2 weeks (around 06/14/2017) for Routine prenatal care.  Future Appointments  Date Time Provider Department Center  06/13/2017  4:00 PM Allie Bossierove, Myra C, MD [facility]  06/15/2017 11:00 AM [facility] 3 [facility]    Federico FlakeKimberly Niles Pranav Lince, MD

## 2017-05-31 NOTE — Patient Instructions (Signed)
Zantac - 150mg  (1 pill ) twice daily   Vaginal Birth After Cesarean Delivery Vaginal birth after cesarean delivery (VBAC) is giving birth vaginally after previously delivering a baby by a cesarean. In the past, if a woman had a cesarean delivery, all births afterward would be done by cesarean delivery. This is no longer true. It can be safe for the mother to try a vaginal delivery after having a cesarean delivery. It is important to discuss VBAC with your health care provider early in the pregnancy so you can understand the risks, benefits, and options. It will give you time to decide what is best in your particular case. The final decision about whether to have a VBAC or repeat cesarean delivery should be between you and your health care provider. Any changes in your health or your baby's health during your pregnancy may make it necessary to change your initial decision about VBAC. Women who plan to have a VBAC should check with their health care provider to be sure that:  The previous cesarean delivery was done with a low transverse uterine cut (incision) (not a vertical classical incision).  The birth canal is big enough for the baby.  There were no other operations on the uterus.  An electronic fetal monitor (EFM) will be on at all times during labor.  An operating room will be available and ready in case an emergency cesarean delivery is needed.  A health care provider and surgical nursing staff will be available at all times during labor to be ready to do an emergency delivery cesarean if necessary.  An anesthesiologist will be present in case an emergency cesarean delivery is needed.  The nursery is prepared and has adequate personnel and necessary equipment available to care for the baby in case of an emergency cesarean delivery. Benefits of VBAC  Shorter stay in the hospital.  Avoidance of risks associated with cesarean delivery, such as: ? Surgical complications, such as opening  of the incision or hernia in the incision. ? Injury to other organs. ? Fever. This can occur if an infection develops after surgery. It can also occur as a reaction to the medicine given to make you numb during the surgery.  Less blood loss and need for blood transfusions.  Lower risk of blood clots and infection.  Shorter recovery.  Decreased risk for having to remove the uterus (hysterectomy).  Decreased risk for the placenta to completely or partially cover the opening of the uterus (placenta previa) with a future pregnancy.  Decrease risk in future labor and delivery. Risks of a VBAC  Tearing (rupture) of the uterus. This is occurs in less than 1% of VBACs. The risk of this happening is higher if: ? Steps are taken to begin the labor process (induce labor) or stimulate or strengthen contractions (augment labor). ? Medicine is used to soften (ripen) the cervix.  Having to remove the uterus (hysterectomy) if it ruptures. VBAC should not be done if:  The previous cesarean delivery was done with a vertical (classical) or T-shaped incision or you do not know what kind of incision was made.  You had a ruptured uterus.  You have had certain types of surgery on your uterus, such as removal of uterine fibroids. Ask your health care provider about other types of surgeries that prevent you from having a VBAC.  You have certain medical or childbirth (obstetrical) problems.  There are problems with the baby.  You have had two previous cesarean deliveries and no  vaginal deliveries. Other facts to know about VBAC:  It is safe to have an epidural anesthetic with VBAC.  It is safe to turn the baby from a breech position (attempt an external cephalic version).  It is safe to try a VBAC with twins.  VBAC may not be successful if your baby weights 8.8 lb (4 kg) or more. However, weight predictions are not always accurate and should not be used alone to decide if VBAC is right for  you.  There is an increased failure rate if the time between the cesarean delivery and VBAC is less than 19 months.  Your health care provider may advise against a VBAC if you have preeclampsia (high blood pressure, protein in the urine, and swelling of face and extremities).  VBAC is often successful if you previously gave birth vaginally.  VBAC is often successful when the labor starts spontaneously before the due date.  Delivering a baby through a VBAC is similar to having a normal spontaneous vaginal delivery. This information is not intended to replace advice given to you by your health care provider. Make sure you discuss any questions you have with your health care provider. Document Released: 07/24/2006 Document Revised: 07/09/2015 Document Reviewed: 08/30/2012 Elsevier Interactive Patient Education  [employer].

## 2017-05-31 NOTE — Progress Notes (Signed)
Decline tdap  

## 2017-06-01 LAB — HIV ANTIBODY (ROUTINE TESTING W REFLEX): HIV Screen 4th Generation wRfx: NONREACTIVE

## 2017-06-01 LAB — CBC
Hematocrit: 32.4 % — ABNORMAL LOW (ref 34.0–46.6)
Hemoglobin: 10.7 g/dL — ABNORMAL LOW (ref 11.1–15.9)
MCH: 28.6 pg (ref 26.6–33.0)
MCHC: 33 g/dL (ref 31.5–35.7)
MCV: 87 fL (ref 79–97)
PLATELETS: 169 10*3/uL (ref 150–379)
RBC: 3.74 x10E6/uL — AB (ref 3.77–5.28)
RDW: 13.9 % (ref 12.3–15.4)
WBC: 10.1 10*3/uL (ref 3.4–10.8)

## 2017-06-01 LAB — GLUCOSE TOLERANCE, 2 HOURS W/ 1HR
GLUCOSE, 1 HOUR: 110 mg/dL (ref 65–179)
GLUCOSE, FASTING: 73 mg/dL (ref 65–91)
Glucose, 2 hour: 87 mg/dL (ref 65–152)

## 2017-06-01 LAB — RPR: RPR Ser Ql: NONREACTIVE

## 2017-06-08 ENCOUNTER — Telehealth: Payer: Self-pay | Admitting: *Deleted

## 2017-06-08 NOTE — Telephone Encounter (Signed)
Pt called in stating she has "extreme swelling in legs and feet with rash that itches". She denies fever, nausea, vomiting. Advised pt to go to [facility] for eval. Pt expressed understanding.

## 2017-06-13 ENCOUNTER — Encounter: Payer: Medicaid Other | Admitting: Obstetrics & Gynecology

## 2017-06-15 ENCOUNTER — Ambulatory Visit (HOSPITAL_COMMUNITY)
Admission: RE | Admit: 2017-06-15 | Discharge: 2017-06-15 | Disposition: A | Discharge status: Home / Self Care | Payer: Medicaid Other | Source: Ambulatory Visit | Attending: Obstetrics and Gynecology | Admitting: Obstetrics and Gynecology

## 2017-06-15 ENCOUNTER — Other Ambulatory Visit (HOSPITAL_COMMUNITY): Payer: Self-pay | Admitting: *Deleted

## 2017-06-15 ENCOUNTER — Encounter (HOSPITAL_COMMUNITY): Payer: Self-pay

## 2017-06-15 DIAGNOSIS — O99212 Obesity complicating pregnancy, second trimester: Secondary | ICD-10-CM | POA: Diagnosis present

## 2017-06-15 DIAGNOSIS — O99323 Drug use complicating pregnancy, third trimester: Secondary | ICD-10-CM | POA: Diagnosis not present

## 2017-06-15 DIAGNOSIS — F1120 Opioid dependence, uncomplicated: Secondary | ICD-10-CM

## 2017-06-15 DIAGNOSIS — O34219 Maternal care for unspecified type scar from previous cesarean delivery: Secondary | ICD-10-CM | POA: Insufficient documentation

## 2017-06-15 DIAGNOSIS — O09522 Supervision of elderly multigravida, second trimester: Secondary | ICD-10-CM | POA: Diagnosis not present

## 2017-06-15 DIAGNOSIS — Z3A30 30 weeks gestation of pregnancy: Secondary | ICD-10-CM | POA: Diagnosis not present

## 2017-06-15 DIAGNOSIS — O99320 Drug use complicating pregnancy, unspecified trimester: Principal | ICD-10-CM

## 2017-06-15 DIAGNOSIS — F1910 Other psychoactive substance abuse, uncomplicated: Secondary | ICD-10-CM | POA: Diagnosis not present

## 2017-06-16 ENCOUNTER — Encounter: Payer: Medicaid Other | Admitting: Obstetrics and Gynecology

## 2017-06-16 ENCOUNTER — Telehealth: Payer: Self-pay | Admitting: Obstetrics and Gynecology

## 2017-06-16 NOTE — Telephone Encounter (Signed)
Patient did not keep her prenatal appointment with [place] today.  Attempted to reach patient, left message for her to call us to reschedule her missed appointment.

## 2017-06-19 ENCOUNTER — Telehealth: Payer: Self-pay | Admitting: Radiology

## 2017-06-19 ENCOUNTER — Ambulatory Visit (INDEPENDENT_AMBULATORY_CARE_PROVIDER_SITE_OTHER): Payer: Medicaid Other | Admitting: Obstetrics & Gynecology

## 2017-06-19 VITALS — BP 111/67 | HR 89 | Wt 217.6 lb

## 2017-06-19 DIAGNOSIS — O34219 Maternal care for unspecified type scar from previous cesarean delivery: Secondary | ICD-10-CM

## 2017-06-19 DIAGNOSIS — Z79891 Long term (current) use of opiate analgesic: Secondary | ICD-10-CM

## 2017-06-19 DIAGNOSIS — O1210 Gestational proteinuria, unspecified trimester: Secondary | ICD-10-CM

## 2017-06-19 DIAGNOSIS — O0992 Supervision of high risk pregnancy, unspecified, second trimester: Secondary | ICD-10-CM

## 2017-06-19 DIAGNOSIS — O09523 Supervision of elderly multigravida, third trimester: Secondary | ICD-10-CM

## 2017-06-19 DIAGNOSIS — F1120 Opioid dependence, uncomplicated: Secondary | ICD-10-CM

## 2017-06-19 DIAGNOSIS — Z8739 Personal history of other diseases of the musculoskeletal system and connective tissue: Secondary | ICD-10-CM

## 2017-06-19 NOTE — Telephone Encounter (Signed)
Left message for patient to call [facility] to reschedule appointment that was missed on 06/16/17.

## 2017-06-19 NOTE — Progress Notes (Signed)
   PRENATAL VISIT NOTE  Subjective:  Vicki [name] is a 37 y.o. [phone] at [redacted]w[redacted]d being seen today for ongoing prenatal care.  She is currently monitored for the following issues for this high-risk pregnancy and has Supervision of high risk pregnancy, antepartum, second trimester; Previous cesarean delivery, antepartum; H/O degenerative disc disease; AMA (advanced maternal age) multigravida 35+; Obesity (BMI 30-39.9); Obesity in pregnancy; Methadone maintenance therapy patient (HCC); Proteinuria in pregnancy, antepartum; and Subclinical hyperthyroidism on their problem list.  Patient reports no complaints.  Contractions: Irritability. Vag. Bleeding: None.  Movement: Present. Denies leaking of fluid.   The following portions of the patient's history were reviewed and updated as appropriate: allergies, current medications, past family history, past medical history, past social history, past surgical history and problem list. Problem list updated.  Objective:   Vitals:   06/19/17 1531  BP: 111/67  Pulse: 89  Weight: 217 lb 9.6 oz (98.7 kg)    Fetal Status: Fetal Heart Rate (bpm): 130   Movement: Present     General:  Alert, oriented and cooperative. Patient is in no acute distress.  Skin: Skin is warm and dry. No rash noted.   Cardiovascular: Normal heart rate noted  Respiratory: Normal respiratory effort, no problems with respiration noted  Abdomen: Soft, gravid, appropriate for gestational age.  Pain/Pressure: Absent     Pelvic: Cervical exam deferred        Extremities: Normal range of motion.  Edema: None  Mental Status: Normal mood and affect. Normal behavior. Normal judgment and thought content.   Assessment and Plan:  Pregnancy: [id number] at [redacted]w[redacted]d  1. Supervision of high risk pregnancy, antepartum, second trimester  2. Proteinuria in pregnancy, antepartum  3. Previous cesarean delivery, antepartum Pt still undecided.  Pt will decide at her next visit. She is 'leaning towards' a  repeat s/p   4. Methadone maintenance therapy patient (HCC) Pt has not scheduled her tour of the NICU.  Pt has been on Methadone (2013) and pain meds (2009)  5. H/O degenerative disc disease  6. Multigravida of advanced maternal age in third trimester  Preterm labor symptoms and general obstetric precautions including but not limited to vaginal bleeding, contractions, leaking of fluid and fetal movement were reviewed in detail with the patient. Please refer to After Visit Summary for other counseling recommendations.  No follow-ups on file.  Future Appointments  Date Time Provider Department Center  07/13/2017  8:00 AM [facility] [place] 3 [facility]    Willodean Rosenthal, MD

## 2017-06-19 NOTE — Patient Instructions (Signed)

## 2017-06-29 ENCOUNTER — Encounter: Payer: Self-pay | Admitting: Obstetrics and Gynecology

## 2017-06-29 ENCOUNTER — Ambulatory Visit (INDEPENDENT_AMBULATORY_CARE_PROVIDER_SITE_OTHER): Payer: Medicaid Other | Admitting: Obstetrics and Gynecology

## 2017-06-29 VITALS — BP 107/65 | HR 89 | Wt 220.5 lb

## 2017-06-29 DIAGNOSIS — Z79891 Long term (current) use of opiate analgesic: Secondary | ICD-10-CM

## 2017-06-29 DIAGNOSIS — O0993 Supervision of high risk pregnancy, unspecified, third trimester: Secondary | ICD-10-CM

## 2017-06-29 DIAGNOSIS — O99213 Obesity complicating pregnancy, third trimester: Secondary | ICD-10-CM

## 2017-06-29 DIAGNOSIS — F1120 Opioid dependence, uncomplicated: Secondary | ICD-10-CM

## 2017-06-29 DIAGNOSIS — O0992 Supervision of high risk pregnancy, unspecified, second trimester: Secondary | ICD-10-CM

## 2017-06-29 DIAGNOSIS — O99210 Obesity complicating pregnancy, unspecified trimester: Secondary | ICD-10-CM

## 2017-06-29 DIAGNOSIS — O09523 Supervision of elderly multigravida, third trimester: Secondary | ICD-10-CM

## 2017-06-29 DIAGNOSIS — E0590 Thyrotoxicosis, unspecified without thyrotoxic crisis or storm: Secondary | ICD-10-CM

## 2017-06-29 DIAGNOSIS — O34219 Maternal care for unspecified type scar from previous cesarean delivery: Secondary | ICD-10-CM

## 2017-06-29 DIAGNOSIS — E669 Obesity, unspecified: Secondary | ICD-10-CM

## 2017-06-29 NOTE — Progress Notes (Signed)
Prenatal Visit Note Date: 06/29/2017 Clinic: [facility]  Subjective:  Vicki [name] is a 37 y.o. [phone] at [redacted]w[redacted]d being seen today for ongoing prenatal care.  She is currently monitored for the following issues for this high-risk pregnancy and has Supervision of high risk pregnancy, antepartum, second trimester; Previous cesarean delivery, antepartum; H/O degenerative disc disease; AMA (advanced maternal age) multigravida 35+; Obesity (BMI 30-39.9); Obesity in pregnancy; Methadone maintenance therapy patient (HCC); Proteinuria in pregnancy, antepartum; and Subclinical hyperthyroidism on their problem list.  Patient reports see below.   Contractions: Not present. Vag. Bleeding: None.  Movement: Present. Denies leaking of fluid.   The following portions of the patient's history were reviewed and updated as appropriate: allergies, current medications, past family history, past medical history, past social history, past surgical history and problem list. Problem list updated.  Objective:   Vitals:   06/29/17 0935  BP: 107/65  Pulse: 89  Weight: 220 lb 8 oz (100 kg)    Fetal Status: Fetal Heart Rate (bpm): 132   Movement: Present     General:  Alert, oriented and cooperative. Patient is in no acute distress.  Skin: Skin is warm and dry. No rash noted.   Cardiovascular: Normal heart rate noted  Respiratory: Normal respiratory effort, no problems with respiration noted  Abdomen: Soft, gravid, appropriate for gestational age. Pain/Pressure: Absent     Pelvic:  Cervical exam deferred        Extremities: Normal range of motion.  Edema: None  Mental Status: Normal mood and affect. Normal behavior. Normal judgment and thought content.   Urinalysis:      Assessment and Plan:  Pregnancy: [id number] at [redacted]w[redacted]d  1. Subclinical hyperthyroidism Asymptomatic. Will get ft4 - T4, free  2. Supervision of high risk pregnancy, antepartum, second trimester Routine care. OCPs - T4,  free  3. Previous cesarean delivery, antepartum Request for repeat c/s on 7/1 sent today  4. Multigravida of advanced maternal age in third trimester No issues  5. Obesity (BMI 30-39.9) No issues  6. Obesity in pregnancy   7. Methadone maintenance therapy patient [facility]) Currently on 40 qday from the [facility] [place]. Pt states she is down from 60 and is off of oxycodone. She receives xanax from Dr. Yehuda Savannah in [place] and she states that he needs a letter from [place] giving the okay to continue it. Long d/w her re: r/b of it and she would like to continue it. Letter sent via epic. Pt states she is down to 1.5 qhs from 1 4x/day. Pt states that she needs to call to schedule her nicu tour.   Preterm labor symptoms and general obstetric precautions including but not limited to vaginal bleeding, contractions, leaking of fluid and fetal movement were reviewed in detail with the patient. Please refer to After Visit Summary for other counseling recommendations.  Return in about 2 weeks (around 07/13/2017) for rob.    [place]ing, MD

## 2017-06-29 NOTE — Progress Notes (Signed)
Error

## 2017-06-30 LAB — T4, FREE: FREE T4: 1.36 ng/dL (ref 0.82–1.77)

## 2017-07-04 ENCOUNTER — Encounter (HOSPITAL_COMMUNITY): Payer: Self-pay

## 2017-07-11 ENCOUNTER — Encounter (HOSPITAL_COMMUNITY): Payer: Self-pay

## 2017-07-13 ENCOUNTER — Encounter: Payer: Medicaid Other | Admitting: Family Medicine

## 2017-07-13 ENCOUNTER — Other Ambulatory Visit (HOSPITAL_COMMUNITY): Payer: Self-pay | Admitting: Obstetrics and Gynecology

## 2017-07-13 ENCOUNTER — Ambulatory Visit (HOSPITAL_COMMUNITY)
Admission: RE | Admit: 2017-07-13 | Discharge: 2017-07-13 | Disposition: A | Discharge status: Home / Self Care | Payer: Medicaid Other | Source: Ambulatory Visit | Attending: Obstetrics and Gynecology | Admitting: Obstetrics and Gynecology

## 2017-07-13 ENCOUNTER — Encounter (HOSPITAL_COMMUNITY): Payer: Self-pay

## 2017-07-13 DIAGNOSIS — F1910 Other psychoactive substance abuse, uncomplicated: Secondary | ICD-10-CM | POA: Insufficient documentation

## 2017-07-13 DIAGNOSIS — Z3A34 34 weeks gestation of pregnancy: Secondary | ICD-10-CM

## 2017-07-13 DIAGNOSIS — O34219 Maternal care for unspecified type scar from previous cesarean delivery: Secondary | ICD-10-CM | POA: Diagnosis present

## 2017-07-13 DIAGNOSIS — O09523 Supervision of elderly multigravida, third trimester: Secondary | ICD-10-CM | POA: Diagnosis present

## 2017-07-13 DIAGNOSIS — O99320 Drug use complicating pregnancy, unspecified trimester: Secondary | ICD-10-CM | POA: Diagnosis present

## 2017-07-13 DIAGNOSIS — F1120 Opioid dependence, uncomplicated: Secondary | ICD-10-CM

## 2017-07-13 DIAGNOSIS — O99213 Obesity complicating pregnancy, third trimester: Secondary | ICD-10-CM

## 2017-07-13 HISTORY — DX: Other psychoactive substance abuse, uncomplicated: F19.10

## 2017-07-13 HISTORY — DX: Opioid dependence, uncomplicated: F11.20

## 2017-07-17 ENCOUNTER — Encounter: Payer: Self-pay | Admitting: Radiology

## 2017-07-17 ENCOUNTER — Ambulatory Visit (INDEPENDENT_AMBULATORY_CARE_PROVIDER_SITE_OTHER): Payer: Medicaid Other | Admitting: Obstetrics & Gynecology

## 2017-07-17 ENCOUNTER — Encounter: Payer: Self-pay | Admitting: Obstetrics & Gynecology

## 2017-07-17 VITALS — BP 113/71 | HR 111 | Wt 228.0 lb

## 2017-07-17 DIAGNOSIS — F1120 Opioid dependence, uncomplicated: Secondary | ICD-10-CM

## 2017-07-17 DIAGNOSIS — O09523 Supervision of elderly multigravida, third trimester: Secondary | ICD-10-CM

## 2017-07-17 DIAGNOSIS — O34219 Maternal care for unspecified type scar from previous cesarean delivery: Secondary | ICD-10-CM

## 2017-07-17 DIAGNOSIS — O99210 Obesity complicating pregnancy, unspecified trimester: Secondary | ICD-10-CM

## 2017-07-17 DIAGNOSIS — O0992 Supervision of high risk pregnancy, unspecified, second trimester: Secondary | ICD-10-CM

## 2017-07-17 NOTE — Progress Notes (Signed)
   PRENATAL VISIT NOTE  Subjective:  Vicki [name] is a 37 y.o. [phone] at 7216w1d being seen today for ongoing prenatal care.  She is currently monitored for the following issues for this high-risk pregnancy and has Supervision of high risk pregnancy, antepartum, second trimester; Previous cesarean delivery, antepartum; H/O degenerative disc disease; AMA (advanced maternal age) multigravida 35+; Obesity (BMI 30-39.9); Obesity in pregnancy; Methadone maintenance therapy patient (HCC); Proteinuria in pregnancy, antepartum; and Subclinical hyperthyroidism on their problem list.  Patient reports no complaints.  Contractions: Not present. Vag. Bleeding: None.  Movement: Present. Denies leaking of fluid.   The following portions of the patient's history were reviewed and updated as appropriate: allergies, current medications, past family history, past medical history, past social history, past surgical history and problem list. Problem list updated.  Objective:   Vitals:   07/17/17 1345  BP: 113/71  Pulse: (!) 111  Weight: 228 lb (103.4 kg)    Fetal Status: Fetal Heart Rate (bpm): 125   Movement: Present     General:  Alert, oriented and cooperative. Patient is in no acute distress.  Skin: Skin is warm and dry. No rash noted.   Cardiovascular: Normal heart rate noted  Respiratory: Normal respiratory effort, no problems with respiration noted  Abdomen: Soft, gravid, appropriate for gestational age.  Pain/Pressure: Absent     Pelvic: Cervical exam deferred        Extremities: Normal range of motion.  Edema: Moderate pitting, indentation subsides rapidly  Mental Status: Normal mood and affect. Normal behavior. Normal judgment and thought content.   Assessment and Plan:  Pregnancy: [id number] at 2816w1d  1. Multigravida of advanced maternal age in third trimester   2. Supervision of high risk pregnancy, antepartum, second trimester   3. Previous cesarean delivery, antepartum - plans  repeat  4. Obesity in pregnancy   5. Methadone maintenance therapy patient [facility](HCC)   Term labor symptoms and general obstetric precautions including but not limited to vaginal bleeding, contractions, leaking of fluid and fetal movement were reviewed in detail with the patient. Please refer to After Visit Summary for other counseling recommendations.  Return in about 1 week (around 07/24/2017).  No future appointments.  Allie BossierMyra C George Haggart, MD

## 2017-07-24 ENCOUNTER — Other Ambulatory Visit (HOSPITAL_COMMUNITY)
Admission: RE | Admit: 2017-07-24 | Discharge: 2017-07-24 | Disposition: A | Discharge status: Home / Self Care | Payer: Medicaid Other | Source: Ambulatory Visit | Attending: Family Medicine | Admitting: Family Medicine

## 2017-07-24 ENCOUNTER — Ambulatory Visit (INDEPENDENT_AMBULATORY_CARE_PROVIDER_SITE_OTHER): Payer: Medicaid Other | Admitting: Family Medicine

## 2017-07-24 ENCOUNTER — Encounter: Payer: Self-pay | Admitting: Family Medicine

## 2017-07-24 VITALS — BP 114/74 | HR 93 | Wt 238.0 lb

## 2017-07-24 DIAGNOSIS — O0992 Supervision of high risk pregnancy, unspecified, second trimester: Secondary | ICD-10-CM | POA: Diagnosis present

## 2017-07-24 DIAGNOSIS — O09523 Supervision of elderly multigravida, third trimester: Secondary | ICD-10-CM | POA: Insufficient documentation

## 2017-07-24 DIAGNOSIS — Z3A36 36 weeks gestation of pregnancy: Secondary | ICD-10-CM | POA: Insufficient documentation

## 2017-07-24 NOTE — Progress Notes (Signed)
   PRENATAL VISIT NOTE  Subjective:  Vicki GambleRobin T [name] is a 37 y.o. [phone] at 8329w1d being seen today for ongoing prenatal care.  She is currently monitored for the following issues for this high-risk pregnancy and has Supervision of high risk pregnancy, antepartum, second trimester; Previous cesarean delivery, antepartum; H/O degenerative disc disease; AMA (advanced maternal age) multigravida 35+; Obesity (BMI 30-39.9); Obesity in pregnancy; Methadone maintenance therapy patient (HCC); Proteinuria in pregnancy, antepartum; and Subclinical hyperthyroidism on their problem list.  Patient reports no complaints.  Contractions: Not present. Vag. Bleeding: None.  Movement: Present. Denies leaking of fluid.   The following portions of the patient's history were reviewed and updated as appropriate: allergies, current medications, past family history, past medical history, past social history, past surgical history and problem list. Problem list updated.  Objective:   Vitals:   07/24/17 1444  BP: 114/74  Pulse: 93  Weight: 238 lb (108 kg)    Fetal Status: Fetal Heart Rate (bpm): 125 Fundal Height: 34 cm Movement: Present  Presentation: Vertex  General:  Alert, oriented and cooperative. Patient is in no acute distress.  Skin: Skin is warm and dry. No rash noted.   Cardiovascular: Normal heart rate noted  Respiratory: Normal respiratory effort, no problems with respiration noted  Abdomen: Soft, gravid, appropriate for gestational age.  Pain/Pressure: Present     Pelvic: Cervical exam performed Dilation: Fingertip Effacement (%): 40 Station: -1  Extremities: Normal range of motion.  Edema: Moderate pitting, indentation subsides rapidly  Mental Status: Normal mood and affect. Normal behavior. Normal judgment and thought content.   Assessment and Plan:  Pregnancy: [id number] at 7229w1d  1. Supervision of high risk pregnancy, antepartum, second trimester Cultures today For RCS NICU tour to be done prior  to delivery--advised of eat sleep console and 5 day admission. - Strep Gp B NAA - GC/Chlamydia probe amp ([facility] [place]  Preterm labor symptoms and general obstetric precautions including but not limited to vaginal bleeding, contractions, leaking of fluid and fetal movement were reviewed in detail with the patient. Please refer to After Visit Summary for other counseling recommendations.  Return in 1 week (on 07/31/2017).  Future Appointments  Date Time Provider Department Center  07/31/2017  2:30 PM Conan Bowensavis, Kelly M, MD [facility]    Reva Boresanya S Anastasya Jewell, MD

## 2017-07-24 NOTE — Patient Instructions (Signed)

## 2017-07-26 LAB — STREP GP B NAA: STREP GROUP B AG: NEGATIVE

## 2017-07-27 LAB — GC/CHLAMYDIA PROBE AMP (~~LOC~~) NOT AT ARMC
Chlamydia: NEGATIVE
Neisseria Gonorrhea: NEGATIVE

## 2017-07-28 ENCOUNTER — Telehealth (HOSPITAL_COMMUNITY): Payer: Self-pay | Admitting: *Deleted

## 2017-07-28 NOTE — Telephone Encounter (Signed)
Preadmission screen  

## 2017-07-31 ENCOUNTER — Ambulatory Visit (INDEPENDENT_AMBULATORY_CARE_PROVIDER_SITE_OTHER): Payer: Medicaid Other | Admitting: Obstetrics and Gynecology

## 2017-07-31 ENCOUNTER — Encounter (HOSPITAL_COMMUNITY): Payer: Self-pay

## 2017-07-31 ENCOUNTER — Encounter: Payer: Self-pay | Admitting: Obstetrics and Gynecology

## 2017-07-31 VITALS — BP 114/76 | HR 89 | Wt 241.0 lb

## 2017-07-31 DIAGNOSIS — O34219 Maternal care for unspecified type scar from previous cesarean delivery: Secondary | ICD-10-CM

## 2017-07-31 DIAGNOSIS — O1210 Gestational proteinuria, unspecified trimester: Secondary | ICD-10-CM

## 2017-07-31 DIAGNOSIS — O09523 Supervision of elderly multigravida, third trimester: Secondary | ICD-10-CM

## 2017-07-31 DIAGNOSIS — O0992 Supervision of high risk pregnancy, unspecified, second trimester: Secondary | ICD-10-CM

## 2017-07-31 DIAGNOSIS — E0590 Thyrotoxicosis, unspecified without thyrotoxic crisis or storm: Secondary | ICD-10-CM

## 2017-07-31 DIAGNOSIS — F1120 Opioid dependence, uncomplicated: Secondary | ICD-10-CM

## 2017-07-31 DIAGNOSIS — O0993 Supervision of high risk pregnancy, unspecified, third trimester: Secondary | ICD-10-CM

## 2017-07-31 NOTE — Progress Notes (Signed)
   PRENATAL VISIT NOTE  Subjective:  Vicki [name] is a 37 y.o. [phone] at 3325w1d being seen today for ongoing prenatal care.  She is currently monitored for the following issues for this high-risk pregnancy and has Supervision of high risk pregnancy, antepartum, second trimester; Previous cesarean delivery, antepartum; H/O degenerative disc disease; AMA (advanced maternal age) multigravida 35+; Obesity (BMI 30-39.9); Obesity in pregnancy; Methadone maintenance therapy patient (HCC); Proteinuria in pregnancy, antepartum; and Subclinical hyperthyroidism on their problem list.  Patient reports no complaints.  Contractions: Irregular. Vag. Bleeding: None.  Movement: Present. Denies leaking of fluid.   The following portions of the patient's history were reviewed and updated as appropriate: allergies, current medications, past family history, past medical history, past social history, past surgical history and problem list. Problem list updated.  Objective:   Vitals:   07/31/17 1507  BP: 114/76  Pulse: 89  Weight: 241 lb (109.3 kg)    Fetal Status: Fetal Heart Rate (bpm): 127   Movement: Present     General:  Alert, oriented and cooperative. Patient is in no acute distress.  Skin: Skin is warm and dry. No rash noted.   Cardiovascular: Normal heart rate noted  Respiratory: Normal respiratory effort, no problems with respiration noted  Abdomen: Soft, gravid, appropriate for gestational age.  Pain/Pressure: Present     Pelvic: Cervical exam deferred        Extremities: Normal range of motion.  Edema: Deep pitting, indentation remains for a short time  Mental Status: Normal mood and affect. Normal behavior. Normal judgment and thought content.   Assessment and Plan:  Pregnancy: [id number] at 4225w1d  1. Previous cesarean delivery, antepartum For RCS, scheduled for 08/13/17  2. Supervision of high risk pregnancy, antepartum, second trimester  3. Multigravida of advanced maternal age in third  trimester  4. Methadone maintenance therapy patient [facility](HCC) Needs scheduled NICU tour Last growth 51st%tile  5. Proteinuria in pregnancy, antepartum  6. Subclinical hyperthyroidism T4 normal 1 month ago  Term labor symptoms and general obstetric precautions including but not limited to vaginal bleeding, contractions, leaking of fluid and fetal movement were reviewed in detail with the patient. Please refer to After Visit Summary for other counseling recommendations.  Return in about 1 week (around 08/07/2017) for OB visit.  Future Appointments  Date Time Provider Department Center  08/11/2017 10:30 AM [facility] PAT 5 [facility] None    Conan BowensKelly M Davis, MD

## 2017-08-08 ENCOUNTER — Encounter: Payer: Medicaid Other | Admitting: Family Medicine

## 2017-08-09 ENCOUNTER — Telehealth (HOSPITAL_COMMUNITY): Payer: Self-pay | Admitting: *Deleted

## 2017-08-09 NOTE — Telephone Encounter (Signed)
Called pt regarding questions of medications taken daily.  Left message.

## 2017-08-11 ENCOUNTER — Encounter: Payer: Medicaid Other | Admitting: Obstetrics & Gynecology

## 2017-08-11 ENCOUNTER — Encounter (HOSPITAL_COMMUNITY)
Admission: RE | Admit: 2017-08-11 | Discharge: 2017-08-11 | Disposition: A | Discharge status: Home / Self Care | Payer: Medicaid Other | Source: Ambulatory Visit | Attending: Obstetrics and Gynecology | Admitting: Obstetrics and Gynecology

## 2017-08-11 LAB — CBC
HEMATOCRIT: 35.6 % — AB (ref 36.0–46.0)
HEMOGLOBIN: 12 g/dL (ref 12.0–15.0)
MCH: 28.2 pg (ref 26.0–34.0)
MCHC: 33.7 g/dL (ref 30.0–36.0)
MCV: 83.8 fL (ref 78.0–100.0)
Platelets: 171 10*3/uL (ref 150–400)
RBC: 4.25 MIL/uL (ref 3.87–5.11)
RDW: 13.7 % (ref 11.5–15.5)
WBC: 9.6 10*3/uL (ref 4.0–10.5)

## 2017-08-11 LAB — TYPE AND SCREEN
ABO/RH(D): A POS
ANTIBODY SCREEN: NEGATIVE

## 2017-08-11 LAB — ABO/RH: ABO/RH(D): A POS

## 2017-08-11 NOTE — Progress Notes (Deleted)
   Patient did not show up today for her scheduled appointment.   Anasia Agro, MD, FACOG Obstetrician & Gynecologist, [facility], [facility]  

## 2017-08-11 NOTE — Patient Instructions (Signed)
Vicki GambleRobin T [name]  08/11/2017   Your procedure is scheduled on:  08/13/2017  [facility] through [facility] of [facility] at 0930 AM.  Pick up the phone at the desk and dial [phone]  Call this number if you have problems the morning of [phone]  Remember:   Do not eat food:(After Midnight) Desps de medianoche.  Do not drink clear liquids: (After Midnight) Desps de medianoche.  Take these medicines the morning of surgery with A SIP OF WATER: take your methadone and oxycodone as prescribed   Do not wear jewelry, make-up or nail polish.  Do not wear lotions, powders, or perfumes. Do not wear deodorant.  Do not shave 48 hours prior to surgery.  Do not bring valuables to the hospital.  [facility] is not   responsible for any belongings or valuables brought to the hospital.  Contacts, dentures or bridgework may not be worn into surgery.  Leave suitcase in the car. After surgery it may be brought to your room.  For patients admitted to the hospital, checkout time is 11:00 AM the day of              discharge.    N/A   Please read over the following fact sheets that you were given:   Surgical Site Infection Prevention

## 2017-08-12 LAB — RPR: RPR Ser Ql: NONREACTIVE

## 2017-08-13 ENCOUNTER — Inpatient Hospital Stay (HOSPITAL_COMMUNITY): Payer: Medicaid Other | Admitting: Anesthesiology

## 2017-08-13 ENCOUNTER — Encounter (HOSPITAL_COMMUNITY): Payer: Self-pay | Admitting: Anesthesiology

## 2017-08-13 ENCOUNTER — Encounter (HOSPITAL_COMMUNITY)
Admission: AD | Disposition: A | Discharge status: Home / Self Care | Payer: Self-pay | Source: Home / Self Care | Attending: Obstetrics and Gynecology

## 2017-08-13 ENCOUNTER — Inpatient Hospital Stay (HOSPITAL_COMMUNITY)
Admission: AD | Admit: 2017-08-13 | Discharge: 2017-08-16 | DRG: 787 | Disposition: A | Discharge status: Home / Self Care | Payer: Medicaid Other | Attending: Obstetrics and Gynecology | Admitting: Obstetrics and Gynecology

## 2017-08-13 DIAGNOSIS — O99214 Obesity complicating childbirth: Secondary | ICD-10-CM | POA: Diagnosis present

## 2017-08-13 DIAGNOSIS — O9081 Anemia of the puerperium: Secondary | ICD-10-CM | POA: Diagnosis not present

## 2017-08-13 DIAGNOSIS — O99324 Drug use complicating childbirth: Secondary | ICD-10-CM | POA: Diagnosis present

## 2017-08-13 DIAGNOSIS — O99334 Smoking (tobacco) complicating childbirth: Secondary | ICD-10-CM | POA: Diagnosis present

## 2017-08-13 DIAGNOSIS — Z9889 Other specified postprocedural states: Secondary | ICD-10-CM

## 2017-08-13 DIAGNOSIS — F1120 Opioid dependence, uncomplicated: Secondary | ICD-10-CM | POA: Diagnosis present

## 2017-08-13 DIAGNOSIS — O34211 Maternal care for low transverse scar from previous cesarean delivery: Secondary | ICD-10-CM | POA: Diagnosis present

## 2017-08-13 DIAGNOSIS — Z880 Allergy status to penicillin: Secondary | ICD-10-CM | POA: Diagnosis not present

## 2017-08-13 DIAGNOSIS — F17210 Nicotine dependence, cigarettes, uncomplicated: Secondary | ICD-10-CM | POA: Diagnosis present

## 2017-08-13 DIAGNOSIS — Z3A39 39 weeks gestation of pregnancy: Secondary | ICD-10-CM

## 2017-08-13 LAB — CBC
HEMATOCRIT: 28.7 % — AB (ref 36.0–46.0)
Hemoglobin: 10.1 g/dL — ABNORMAL LOW (ref 12.0–15.0)
MCH: 29.4 pg (ref 26.0–34.0)
MCHC: 35.2 g/dL (ref 30.0–36.0)
MCV: 83.4 fL (ref 78.0–100.0)
Platelets: 144 10*3/uL — ABNORMAL LOW (ref 150–400)
RBC: 3.44 MIL/uL — AB (ref 3.87–5.11)
RDW: 13.8 % (ref 11.5–15.5)
WBC: 16.7 10*3/uL — ABNORMAL HIGH (ref 4.0–10.5)

## 2017-08-13 LAB — CREATININE, SERUM
Creatinine, Ser: 0.65 mg/dL (ref 0.44–1.00)
GFR calc Af Amer: >60 mL/min (ref >60)

## 2017-08-13 SURGERY — Surgical Case
Anesthesia: Spinal | Site: Abdomen | Wound class: Clean Contaminated

## 2017-08-13 MED ORDER — SENNOSIDES-DOCUSATE SODIUM 8.6-50 MG PO TABS
2.0000 | ORAL_TABLET | ORAL | Status: DC
  Administered 2017-08-14 – 2017-08-15 (×3): 2 via ORAL
  Filled 2017-08-13 (×3): qty 2

## 2017-08-13 MED ORDER — MORPHINE SULFATE (PF) 0.5 MG/ML IJ SOLN
INTRAMUSCULAR | Status: AC
  Filled 2017-08-13: qty 10

## 2017-08-13 MED ORDER — SODIUM CHLORIDE 0.9 % IR SOLN
Status: DC | PRN
  Administered 2017-08-13: 1

## 2017-08-13 MED ORDER — MENTHOL 3 MG MT LOZG
1.0000 | LOZENGE | OROMUCOSAL | Status: DC | PRN
Start: 2017-08-13 — End: 2017-08-16

## 2017-08-13 MED ORDER — GLYCOPYRROLATE 0.2 MG/ML IJ SOLN
INTRAMUSCULAR | Status: DC | PRN
  Administered 2017-08-13: 0.2 mg via INTRAVENOUS

## 2017-08-13 MED ORDER — OXYTOCIN 10 UNIT/ML IJ SOLN
INTRAVENOUS | Status: DC | PRN
  Administered 2017-08-13: 40 [IU] via INTRAVENOUS

## 2017-08-13 MED ORDER — NALBUPHINE HCL 10 MG/ML IJ SOLN: 5.0000 mg | INTRAMUSCULAR | Status: DC | PRN

## 2017-08-13 MED ORDER — METOCLOPRAMIDE HCL 5 MG/ML IJ SOLN
INTRAMUSCULAR | Status: AC
  Filled 2017-08-13: qty 2

## 2017-08-13 MED ORDER — KETOROLAC TROMETHAMINE 30 MG/ML IJ SOLN: 30.0000 mg | Freq: Four times a day (QID) | INTRAMUSCULAR | Status: AC | PRN

## 2017-08-13 MED ORDER — METRONIDAZOLE IN NACL 5-0.79 MG/ML-% IV SOLN
500.0000 mg | INTRAVENOUS | Status: DC
  Filled 2017-08-13: qty 100

## 2017-08-13 MED ORDER — ACETAMINOPHEN 325 MG PO TABS
650.0000 mg | ORAL_TABLET | ORAL | Status: DC | PRN
  Administered 2017-08-15: 650 mg via ORAL
  Filled 2017-08-13: qty 2

## 2017-08-13 MED ORDER — FENTANYL CITRATE (PF) 250 MCG/5ML IJ SOLN
INTRAMUSCULAR | Status: AC
  Filled 2017-08-13: qty 5

## 2017-08-13 MED ORDER — PROMETHAZINE HCL 25 MG/ML IJ SOLN: 6.2500 mg | INTRAMUSCULAR | Status: DC | PRN

## 2017-08-13 MED ORDER — ZOLPIDEM TARTRATE 5 MG PO TABS: 5.0000 mg | ORAL_TABLET | Freq: Every evening | ORAL | Status: DC | PRN

## 2017-08-13 MED ORDER — SIMETHICONE 80 MG PO CHEW
80.0000 mg | CHEWABLE_TABLET | ORAL | Status: DC | PRN
  Administered 2017-08-15: 80 mg via ORAL

## 2017-08-13 MED ORDER — HYDROMORPHONE HCL 1 MG/ML IJ SOLN
INTRAMUSCULAR | Status: AC
  Filled 2017-08-13: qty 0.5

## 2017-08-13 MED ORDER — SIMETHICONE 80 MG PO CHEW
80.0000 mg | CHEWABLE_TABLET | Freq: Three times a day (TID) | ORAL | Status: DC
  Administered 2017-08-13 – 2017-08-16 (×8): 80 mg via ORAL
  Filled 2017-08-13 (×8): qty 1

## 2017-08-13 MED ORDER — MEPERIDINE HCL 25 MG/ML IJ SOLN: 6.2500 mg | INTRAMUSCULAR | Status: DC | PRN

## 2017-08-13 MED ORDER — SCOPOLAMINE 1 MG/3DAYS TD PT72
1.0000 | MEDICATED_PATCH | Freq: Once | TRANSDERMAL | Status: DC
  Filled 2017-08-13: qty 1

## 2017-08-13 MED ORDER — KETAMINE HCL 10 MG/ML IJ SOLN
INTRAMUSCULAR | Status: DC | PRN
  Administered 2017-08-13: 30 mg via INTRAVENOUS

## 2017-08-13 MED ORDER — GENTAMICIN SULFATE 40 MG/ML IJ SOLN
INTRAVENOUS | Status: DC
  Filled 2017-08-13: qty 12.5

## 2017-08-13 MED ORDER — SOD CITRATE-CITRIC ACID 500-334 MG/5ML PO SOLN
30.0000 mL | ORAL | Status: DC
Start: 2017-08-14 — End: 2017-08-13

## 2017-08-13 MED ORDER — OXYTOCIN 10 UNIT/ML IJ SOLN
INTRAMUSCULAR | Status: AC
  Filled 2017-08-13: qty 4

## 2017-08-13 MED ORDER — ALBUMIN HUMAN 5 % IV SOLN
INTRAVENOUS | Status: DC | PRN
  Administered 2017-08-13 (×2): via INTRAVENOUS

## 2017-08-13 MED ORDER — GENTAMICIN SULFATE 40 MG/ML IJ SOLN
5.0000 mg/kg | INTRAVENOUS | Status: AC
  Administered 2017-08-13: 400 mg via INTRAVENOUS
  Filled 2017-08-13: qty 10

## 2017-08-13 MED ORDER — SUCCINYLCHOLINE CHLORIDE 200 MG/10ML IV SOSY
PREFILLED_SYRINGE | INTRAVENOUS | Status: AC
  Filled 2017-08-13: qty 10

## 2017-08-13 MED ORDER — IBUPROFEN 800 MG PO TABS
800.0000 mg | ORAL_TABLET | Freq: Three times a day (TID) | ORAL | Status: DC
  Administered 2017-08-14 – 2017-08-16 (×6): 800 mg via ORAL
  Filled 2017-08-13 (×6): qty 1

## 2017-08-13 MED ORDER — LACTATED RINGERS IV SOLN
INTRAVENOUS | Status: DC
  Administered 2017-08-13 (×4): via INTRAVENOUS

## 2017-08-13 MED ORDER — OXYCODONE HCL 5 MG PO TABS
10.0000 mg | ORAL_TABLET | Freq: Three times a day (TID) | ORAL | Status: DC
  Administered 2017-08-13 – 2017-08-16 (×10): 10 mg via ORAL
  Filled 2017-08-13 (×11): qty 2

## 2017-08-13 MED ORDER — NALBUPHINE HCL 10 MG/ML IJ SOLN: 5.0000 mg | Freq: Once | INTRAMUSCULAR | Status: DC | PRN

## 2017-08-13 MED ORDER — KETOROLAC TROMETHAMINE 30 MG/ML IJ SOLN
30.0000 mg | Freq: Four times a day (QID) | INTRAMUSCULAR | Status: AC
  Administered 2017-08-13 – 2017-08-14 (×4): 30 mg via INTRAVENOUS
  Filled 2017-08-13 (×4): qty 1

## 2017-08-13 MED ORDER — ENOXAPARIN SODIUM 60 MG/0.6ML ~~LOC~~ SOLN
50.0000 mg | SUBCUTANEOUS | Status: DC
  Administered 2017-08-14: 60 mg via SUBCUTANEOUS
  Administered 2017-08-15 – 2017-08-16 (×2): 50 mg via SUBCUTANEOUS
  Filled 2017-08-13 (×4): qty 0.6

## 2017-08-13 MED ORDER — DEXAMETHASONE SODIUM PHOSPHATE 4 MG/ML IJ SOLN
INTRAMUSCULAR | Status: DC | PRN
  Administered 2017-08-13: 4 mg via INTRAVENOUS

## 2017-08-13 MED ORDER — NALOXONE HCL 0.4 MG/ML IJ SOLN
0.4000 mg | INTRAMUSCULAR | Status: DC | PRN
Start: 2017-08-13 — End: 2017-08-16

## 2017-08-13 MED ORDER — METHADONE HCL 10 MG PO TABS
10.0000 mg | ORAL_TABLET | Freq: Every day | ORAL | Status: DC
Start: 2017-08-13 — End: 2017-08-16
  Administered 2017-08-13 – 2017-08-16 (×15): 10 mg via ORAL
  Filled 2017-08-13 (×15): qty 1

## 2017-08-13 MED ORDER — LACTATED RINGERS IV SOLN: INTRAVENOUS | Status: DC

## 2017-08-13 MED ORDER — SIMETHICONE 80 MG PO CHEW
80.0000 mg | CHEWABLE_TABLET | ORAL | Status: DC
  Administered 2017-08-14 – 2017-08-15 (×2): 80 mg via ORAL
  Filled 2017-08-13 (×3): qty 1

## 2017-08-13 MED ORDER — WITCH HAZEL-GLYCERIN EX PADS: 1.0000 "application " | MEDICATED_PAD | CUTANEOUS | Status: DC | PRN

## 2017-08-13 MED ORDER — PHENYLEPHRINE 40 MCG/ML (10ML) SYRINGE FOR IV PUSH (FOR BLOOD PRESSURE SUPPORT)
PREFILLED_SYRINGE | INTRAVENOUS | Status: AC
  Filled 2017-08-13: qty 10

## 2017-08-13 MED ORDER — SUCCINYLCHOLINE CHLORIDE 20 MG/ML IJ SOLN
INTRAMUSCULAR | Status: DC | PRN
  Administered 2017-08-13: 120 mg via INTRAVENOUS

## 2017-08-13 MED ORDER — FENTANYL CITRATE (PF) 100 MCG/2ML IJ SOLN
INTRAMUSCULAR | Status: DC | PRN
  Administered 2017-08-13: 150 ug via INTRAVENOUS
  Administered 2017-08-13: 250 ug via INTRAVENOUS
  Administered 2017-08-13: 100 ug via INTRAVENOUS

## 2017-08-13 MED ORDER — DEXAMETHASONE SODIUM PHOSPHATE 4 MG/ML IJ SOLN
INTRAMUSCULAR | Status: AC
  Filled 2017-08-13: qty 1

## 2017-08-13 MED ORDER — PRENATAL MULTIVITAMIN CH
1.0000 | ORAL_TABLET | Freq: Every day | ORAL | Status: DC
  Administered 2017-08-14 – 2017-08-16 (×3): 1 via ORAL
  Filled 2017-08-13 (×3): qty 1

## 2017-08-13 MED ORDER — ONDANSETRON HCL 4 MG/2ML IJ SOLN
INTRAMUSCULAR | Status: DC | PRN
  Administered 2017-08-13: 4 mg via INTRAVENOUS

## 2017-08-13 MED ORDER — ACETAMINOPHEN 10 MG/ML IV SOLN
1000.0000 mg | Freq: Once | INTRAVENOUS | Status: DC | PRN
  Administered 2017-08-13: 1000 mg via INTRAVENOUS

## 2017-08-13 MED ORDER — PROPOFOL 10 MG/ML IV BOLUS
INTRAVENOUS | Status: AC
  Filled 2017-08-13: qty 20

## 2017-08-13 MED ORDER — LACTATED RINGERS IV SOLN
INTRAVENOUS | Status: DC | PRN
  Administered 2017-08-13: 12:00:00 via INTRAVENOUS

## 2017-08-13 MED ORDER — SOD CITRATE-CITRIC ACID 500-334 MG/5ML PO SOLN
30.0000 mL | ORAL | Status: AC
  Administered 2017-08-13: 30 mL via ORAL
  Filled 2017-08-13: qty 15

## 2017-08-13 MED ORDER — PHENYLEPHRINE 8 MG IN D5W 100 ML (0.08MG/ML) PREMIX OPTIME
INJECTION | INTRAVENOUS | Status: AC
  Filled 2017-08-13: qty 100

## 2017-08-13 MED ORDER — NALOXONE HCL 4 MG/10ML IJ SOLN
1.0000 ug/kg/h | INTRAVENOUS | Status: DC | PRN
  Filled 2017-08-13: qty 5

## 2017-08-13 MED ORDER — DIPHENHYDRAMINE HCL 50 MG/ML IJ SOLN: 12.5000 mg | INTRAMUSCULAR | Status: DC | PRN

## 2017-08-13 MED ORDER — ONDANSETRON HCL 4 MG/2ML IJ SOLN
INTRAMUSCULAR | Status: AC
  Filled 2017-08-13: qty 2

## 2017-08-13 MED ORDER — ALPRAZOLAM 0.5 MG PO TABS
1.0000 mg | ORAL_TABLET | Freq: Two times a day (BID) | ORAL | Status: DC | PRN
Start: 2017-08-13 — End: 2017-08-16

## 2017-08-13 MED ORDER — SODIUM CHLORIDE 0.9% FLUSH: 3.0000 mL | INTRAVENOUS | Status: DC | PRN

## 2017-08-13 MED ORDER — HYDROCODONE-ACETAMINOPHEN 7.5-325 MG PO TABS: 1.0000 | ORAL_TABLET | Freq: Once | ORAL | Status: DC | PRN

## 2017-08-13 MED ORDER — HYDROMORPHONE HCL 1 MG/ML IJ SOLN
INTRAMUSCULAR | Status: AC
  Filled 2017-08-13: qty 2

## 2017-08-13 MED ORDER — METOCLOPRAMIDE HCL 5 MG/ML IJ SOLN
INTRAMUSCULAR | Status: DC | PRN
  Administered 2017-08-13: 10 mg via INTRAVENOUS

## 2017-08-13 MED ORDER — PHENYLEPHRINE HCL 10 MG/ML IJ SOLN
INTRAMUSCULAR | Status: DC | PRN
  Administered 2017-08-13: 120 ug via INTRAVENOUS
  Administered 2017-08-13: 80 ug via INTRAVENOUS

## 2017-08-13 MED ORDER — KETAMINE HCL 10 MG/ML IJ SOLN
INTRAMUSCULAR | Status: AC
  Filled 2017-08-13: qty 1

## 2017-08-13 MED ORDER — DIPHENHYDRAMINE HCL 25 MG PO CAPS: 25.0000 mg | ORAL_CAPSULE | Freq: Four times a day (QID) | ORAL | Status: DC | PRN

## 2017-08-13 MED ORDER — HYDROMORPHONE HCL 1 MG/ML IJ SOLN: 1.0000 mg | INTRAMUSCULAR | Status: DC | PRN

## 2017-08-13 MED ORDER — OXYCODONE HCL 10 MG PO TABS: 10.0000 mg | ORAL_TABLET | Freq: Three times a day (TID) | ORAL | Status: DC

## 2017-08-13 MED ORDER — TETANUS-DIPHTH-ACELL PERTUSSIS 5-2.5-18.5 LF-MCG/0.5 IM SUSP: 0.5000 mL | Freq: Once | INTRAMUSCULAR | Status: DC

## 2017-08-13 MED ORDER — ACETAMINOPHEN 10 MG/ML IV SOLN
INTRAVENOUS | Status: AC
  Administered 2017-08-13: 1000 mg via INTRAVENOUS
  Filled 2017-08-13: qty 100

## 2017-08-13 MED ORDER — DIBUCAINE 1 % RE OINT: 1.0000 "application " | TOPICAL_OINTMENT | RECTAL | Status: DC | PRN

## 2017-08-13 MED ORDER — HYDROMORPHONE HCL 1 MG/ML IJ SOLN
INTRAMUSCULAR | Status: DC | PRN
  Administered 2017-08-13 (×2): 1 mg via INTRAVENOUS

## 2017-08-13 MED ORDER — OXYTOCIN 40 UNITS IN LACTATED RINGERS INFUSION - SIMPLE MED: 2.5000 [IU]/h | INTRAVENOUS | Status: AC

## 2017-08-13 MED ORDER — DIPHENHYDRAMINE HCL 25 MG PO CAPS: 25.0000 mg | ORAL_CAPSULE | ORAL | Status: DC | PRN

## 2017-08-13 MED ORDER — PROPOFOL 10 MG/ML IV BOLUS
INTRAVENOUS | Status: DC | PRN
  Administered 2017-08-13 (×2): 50 mg via INTRAVENOUS
  Administered 2017-08-13: 200 mg via INTRAVENOUS

## 2017-08-13 MED ORDER — LACTATED RINGERS IV SOLN
INTRAVENOUS | Status: DC
  Administered 2017-08-13: 18:00:00 via INTRAVENOUS

## 2017-08-13 MED ORDER — ONDANSETRON HCL 4 MG/2ML IJ SOLN: 4.0000 mg | Freq: Three times a day (TID) | INTRAMUSCULAR | Status: DC | PRN

## 2017-08-13 MED ORDER — HYDROMORPHONE HCL 1 MG/ML IJ SOLN
0.2500 mg | INTRAMUSCULAR | Status: DC | PRN
  Administered 2017-08-13: 0.5 mg via INTRAVENOUS

## 2017-08-13 MED ORDER — IBUPROFEN 800 MG PO TABS: 800.0000 mg | ORAL_TABLET | Freq: Three times a day (TID) | ORAL | Status: DC

## 2017-08-13 MED ORDER — PRENATAL VITAMINS 0.8 MG PO TABS: 1.0000 | ORAL_TABLET | Freq: Every day | ORAL | Status: DC

## 2017-08-13 MED ORDER — COCONUT OIL OIL: 1.0000 "application " | TOPICAL_OIL | Status: DC | PRN

## 2017-08-13 MED ORDER — EPHEDRINE SULFATE 50 MG/ML IJ SOLN
INTRAMUSCULAR | Status: DC | PRN
  Administered 2017-08-13: 10 mg via INTRAVENOUS

## 2017-08-13 SURGICAL SUPPLY — 41 items
APL SKNCLS STERI-STRIP NONHPOA (GAUZE/BANDAGES/DRESSINGS) ×1
BENZOIN TINCTURE PRP APPL 2/3 (GAUZE/BANDAGES/DRESSINGS) ×3 IMPLANT
CHLORAPREP W/TINT 26ML (MISCELLANEOUS) ×3 IMPLANT
CLAMP CORD UMBIL (MISCELLANEOUS) IMPLANT
CLOSURE WOUND 1/2 X4 (GAUZE/BANDAGES/DRESSINGS) ×1
CLOTH BEACON ORANGE TIMEOUT ST (SAFETY) ×3 IMPLANT
DRAPE C SECTION CLR SCREEN (DRAPES) IMPLANT
DRSG OPSITE POSTOP 4X10 (GAUZE/BANDAGES/DRESSINGS) ×3 IMPLANT
ELECT REM PT RETURN 9FT ADLT (ELECTROSURGICAL) ×3
ELECTRODE REM PT RTRN 9FT ADLT (ELECTROSURGICAL) ×1 IMPLANT
EXTRACTOR VACUUM M CUP 4 TUBE (SUCTIONS) IMPLANT
EXTRACTOR VACUUM M CUP 4' TUBE (SUCTIONS)
GLOVE BIO SURGEON STRL SZ7.5 (GLOVE) ×3 IMPLANT
GLOVE BIOGEL PI IND STRL 7.0 (GLOVE) ×1 IMPLANT
GLOVE BIOGEL PI INDICATOR 7.0 (GLOVE) ×2
GOWN STRL REUS W/TWL 2XL LVL3 (GOWN DISPOSABLE) ×3 IMPLANT
GOWN STRL REUS W/TWL LRG LVL3 (GOWN DISPOSABLE) ×6 IMPLANT
HEMOSTAT ARISTA ABSORB 3G PWDR (MISCELLANEOUS) ×2 IMPLANT
KIT ABG SYR 3ML LUER SLIP (SYRINGE) IMPLANT
NDL HYPO 25X5/8 SAFETYGLIDE (NEEDLE) IMPLANT
NEEDLE HYPO 22GX1.5 SAFETY (NEEDLE) ×3 IMPLANT
NEEDLE HYPO 25X5/8 SAFETYGLIDE (NEEDLE) IMPLANT
NS IRRIG 1000ML POUR BTL (IV SOLUTION) ×3 IMPLANT
PACK C SECTION WH (CUSTOM PROCEDURE TRAY) ×3 IMPLANT
PAD OB MATERNITY 4.3X12.25 (PERSONAL CARE ITEMS) ×3 IMPLANT
PENCIL SMOKE EVAC W/HOLSTER (ELECTROSURGICAL) ×3 IMPLANT
RTRCTR C-SECT PINK 25CM LRG (MISCELLANEOUS) ×3 IMPLANT
STRIP CLOSURE SKIN 1/2X4 (GAUZE/BANDAGES/DRESSINGS) ×2 IMPLANT
SUT CHROMIC 1 CTX 36 (SUTURE) ×6 IMPLANT
SUT VIC AB 1 CT1 36 (SUTURE) ×6 IMPLANT
SUT VIC AB 2-0 CT1 (SUTURE) ×3 IMPLANT
SUT VIC AB 2-0 CT1 27 (SUTURE) ×3
SUT VIC AB 2-0 CT1 TAPERPNT 27 (SUTURE) ×1 IMPLANT
SUT VIC AB 3-0 CT1 27 (SUTURE) ×6
SUT VIC AB 3-0 CT1 TAPERPNT 27 (SUTURE) ×2 IMPLANT
SUT VIC AB 3-0 SH 27 (SUTURE)
SUT VIC AB 3-0 SH 27X BRD (SUTURE) IMPLANT
SUT VIC AB 4-0 KS 27 (SUTURE) ×3 IMPLANT
SYR BULB IRRIGATION 50ML (SYRINGE) IMPLANT
TOWEL OR 17X24 6PK STRL BLUE (TOWEL DISPOSABLE) ×3 IMPLANT
TRAY FOLEY W/BAG SLVR 14FR LF (SET/KITS/TRAYS/PACK) ×3 IMPLANT

## 2017-08-13 NOTE — Op Note (Signed)
Vicki [name] PROCEDURE DATE: 08/13/2017  PREOPERATIVE DIAGNOSES: Intrauterine pregnancy at 5103w0d weeks gestation; prior c section desire for repeat  POSTOPERATIVE DIAGNOSES: The same  PROCEDURE: Repeat Low Transverse Cesarean Section  SURGEON:  Dr. Nettie ElmMichael Zarion Oliff  ASSISTANT:  NA  ANESTHESIOLOGY TEAM: Anesthesiologist: Trevor IhaHouser, Stephen A, MD CRNA: Graciela HusbandsFussell, Wynn O, CRNA  INDICATIONS: Vicki GambleRobin T [name] is a 37 y.o. [phone] at 74103w0d here for cesarean section secondary to the indications listed under preoperative diagnoses; please see preoperative note for further details.  The risks of cesarean section were discussed with the patient including but were not limited to: bleeding which may require transfusion or reoperation; infection which may require antibiotics; injury to bowel, bladder, ureters or other surrounding organs; injury to the fetus; need for additional procedures including hysterectomy in the event of a life-threatening hemorrhage; placental abnormalities wth subsequent pregnancies, incisional problems, thromboembolic phenomenon and other postoperative/anesthesia complications.   The patient concurred with the proposed plan, giving informed written consent for the procedure.    FINDINGS:  Viable female infant in cephalic presentation.  Apgars as recorded   Clear amniotic fluid.  Intact placenta, three vessel cord.  Normal uterus, fallopian tubes and ovaries bilaterally.  ANESTHESIA: GETA INTRAVENOUS FLUIDS: As recorded ml   ESTIMATED BLOOD LOSS: 1500 ml URINE OUTPUT:  As recorded  SPECIMENS: Placenta sent to pathology COMPLICATIONS: None immediate  PROCEDURE IN DETAIL:  The patient preoperatively received intravenous antibiotics and had sequential compression devices applied to her lower extremities.  She was then taken to the operating room where GETA was administered due to unable to place spinal. She was then placed in a dorsal supine position with a leftward tilt, and prepped and  draped in a sterile manner.  A foley catheter was placed into her bladder and attached to constant gravity.  After an adequate timeout was performed, a Pfannenstiel skin incision was made with scalpel  carried through to the underlying layer of fascia. The fascia was incised in the midline, and this incision was extended bilaterally using the Mayo scissors.  Kocher clamps were applied to the superior aspect of the fascial incision and the underlying rectus muscles were dissected off bluntly.  A similar process was carried out on the inferior aspect of the fascial incision. The rectus muscles were separated in the midline and the peritoneum was entered bluntly. The Alexis self-retaining retractor was introduced into the abdominal cavity. The uterovesical reflection was identified and bladder flap was created with sharp and blount discretion.  Attention was turned to the lower uterine segment where a low transverse hysterotomy was made with a scalpel and extended bilaterally bluntly.  The infant was successfully delivered, the cord was clamped and cut after one minute, and the infant was handed over to the awaiting neonatology team. Uterine massage was then administered, and the placenta delivered intact with a three-vessel cord. The uterus was then cleared of clots and debris.  The hysterotomy was closed with 0 Vicryl in a running locked fashion, and an imbricating layer was also placed with 0 Vicryl. There was some oozing noted at the bladder flap. [name] was placed and then the bladder flap was closed.   The pelvis was cleared of all clot and debris. Hemostasis was confirmed on all surfaces.  The retractor was removed.  The peritoneum and abd muscles were closed with 2/0 Vicryl.  The fascia was then closed using 0 Vicryl.  The subcutaneous layer was irrigated, and reapproximated with 2/0 Vicryl , and the skin was closed  with a 4-0 Vicryl subcuticular stitch. Dressing was applied. The patient tolerated the  procedure well. Sponge, instrument and needle counts were correct x 3.  She was taken to the recovery room in stable condition.    Nettie Elm, MD, FACOG Obstetrician & Gynecologist, [facility] for [facility], [facility]

## 2017-08-13 NOTE — Lactation Note (Signed)
This note was copied from a baby's chart. Lactation Consultation Note Baby 10 hrs old. Mom's 2nd child. Mom didn't BF her now 37 yr old. Mom has semi flat nipples. Shells given and hand pump. Encouraged to pre-pump before latching. Unable to see all of breast d/t mom holding baby in cradle position. LC saw Rt. Nipple. Mom holding baby STS. Mom is on Methadone, BF suggested. Mom has a pacifier in crib. Discussed w/mom usually don't recommend pacifiers for 2 weeks, since baby may be getting irritable, will probably need to use around 72 hrs old for consoling. Encouraged STS and feedings as much as possible. Mom encouraged to feed baby 8-12 times/24 hours and with feeding cues. Mom encouraged to waken baby for feeds if hasn't cued in 3 hrs. Call for questions or assistance. Discussed w/mom using DEBP to supplement w/to aide in withdrawals.  [facility]/LC brochure given w/resources, support groups and LC services.   Patient Name: [name]'FToday's Date: 08/13/2017 Reason for consult: Initial assessment   Maternal Data Does the patient have breastfeeding experience prior to this delivery?: No  Feeding Feeding Type: Breast Fed  LATCH Score Latch: Too sleepy or reluctant, no latch achieved, no sucking elicited.  Audible Swallowing: A few with stimulation  Type of Nipple: Flat(semi flat)  Comfort (Breast/Nipple): Soft / non-tender  Hold (Positioning): Assistance needed to correctly position infant at breast and maintain latch.  LATCH Score: 6  Interventions Interventions: Breast feeding basics reviewed;Support pillows;Position options;Skin to skin;Pre-pump if needed;Hand pump  Lactation Tools Discussed/Used Tools: Shells;Pump Shell Type: Inverted Breast pump type: Manual WIC Program: Yes Pump Review: Milk Storage   Consult Status Consult Status: Follow-up Date: 08/14/17 Follow-up type: In-patient    Soliyana Mcchristian, Diamond NickelLAURA G 08/13/2017, 10:30 PM

## 2017-08-13 NOTE — Anesthesia Procedure Notes (Signed)
Procedure Name: Intubation Date/Time: 08/13/2017 12:15 PM Performed by: Flossie Dibble, CRNA Pre-anesthesia Checklist: Patient identified, Patient being monitored, Timeout performed, Emergency Drugs available and Suction available Patient Re-evaluated:Patient Re-evaluated prior to induction Oxygen Delivery Method: Circle System Utilized and Circle system utilized Preoxygenation: Pre-oxygenation with 100% oxygen Induction Type: IV induction, Rapid sequence and Cricoid Pressure applied Laryngoscope Size: 3, Glidescope and Mac Grade View: Grade I Tube type: Oral Tube size: 7.0 mm Number of attempts: 1 Airway Equipment and Method: stylet and Video-laryngoscopy Placement Confirmation: ETT inserted through vocal cords under direct vision,  positive ETCO2 and breath sounds checked- equal and bilateral Secured at: 21 cm Tube secured with: Tape Dental Injury: Teeth and Oropharynx as per pre-operative assessment

## 2017-08-13 NOTE — Transfer of Care (Signed)
Immediate Anesthesia Transfer of Care Note  Patient: Vicki GambleRobin T Janosik  Procedure(s) Performed: REPEAT CESAREAN SECTION (N/A Abdomen)  Patient Location: PACU  Anesthesia Type:General  Level of Consciousness: awake, alert  and oriented  Airway & Oxygen Therapy: Patient Spontanous Breathing and Patient connected to nasal cannula oxygen  Post-op Assessment: Report given to RN and Post -op Vital signs reviewed and stable  Post vital signs: Reviewed and stable  Last Vitals:  Vitals Value Taken Time  BP 110/80 08/13/2017  1:30 PM  Temp    Pulse 83 08/13/2017  1:37 PM  Resp 18 08/13/2017  1:37 PM  SpO2 100 % 08/13/2017  1:37 PM  Vitals shown include unvalidated device data.  Last Pain:  Vitals:   08/13/17 1320  TempSrc: Oral  PainSc:          Complications: No apparent anesthesia complications

## 2017-08-13 NOTE — Anesthesia Preprocedure Evaluation (Addendum)
Anesthesia Evaluation  Patient identified by MRN, date of birth, ID band Patient awake    Reviewed: Allergy & Precautions, NPO status , Patient's Chart, lab work & pertinent test results  Airway Mallampati: II  TM Distance: >3 FB Neck ROM: Full    Dental no notable dental hx. (+) Teeth Intact   Pulmonary Current Smoker,    Pulmonary exam normal breath sounds clear to auscultation       Cardiovascular negative cardio ROS Normal cardiovascular exam Rhythm:Regular Rate:Normal     Neuro/Psych negative neurological ROS  negative psych ROS   GI/Hepatic   Endo/Other  Morbid obesity  Renal/GU Renal disease     Musculoskeletal   Abdominal (+) + obese,   Peds  Hematology   Anesthesia Other Findings   Reproductive/Obstetrics (+) Pregnancy                             Lab Results  Component Value Date   WBC 9.6 08/11/2017   HGB 12.0 08/11/2017   HCT 35.6 (L) 08/11/2017   MCV 83.8 08/11/2017   PLT 171 08/11/2017    Anesthesia Physical Anesthesia Plan  ASA: III  Anesthesia Plan: Spinal and General   Post-op Pain Management:    Induction: Intravenous  PONV Risk Score and Plan: Treatment may vary due to age or medical condition  Airway Management Planned: Oral ETT  Additional Equipment:   Intra-op Plan:   Post-operative Plan: Extubation in OR  Informed Consent: I have reviewed the patients History and Physical, chart, labs and discussed the procedure including the risks, benefits and alternatives for the proposed anesthesia with the patient or authorized representative who has indicated his/her understanding and acceptance.   Dental advisory given  Plan Discussed with: CRNA  Anesthesia Plan Comments: (Pt 50mg  of methadone and 10mg  of oxycodeone daily. Normally Methadone is divided into 30mg  po in the AM and 20 mg Po in the PM  With the 5 mg og of Oxycodone in the Am and %mg in the  PM.  This morning she took 5 0mg  of methadone and 10 mg of oxycodone prior to coming here. Expect her to return to her normal pain regimen post op. )      Anesthesia Quick Evaluation

## 2017-08-13 NOTE — H&P (Signed)
Vicki [name] is a 89 y.A.[id number].G8P1061 IUP 39 weeks presents for repeatt c section. Prenatal care complicated by AMA, H/O c section and H/O substance abuse.  OB History    Gravida  8   Para  1   Term  1   Preterm  0   AB  6   Living  1     SAB  5   TAB  0   Ectopic  1   Multiple  0   Live Births  1          Past Medical History:  Diagnosis Date  . Back pain   . Degenerative disc disease, lumbar   . History of ectopic pregnancy   . Methadone maintenance therapy patient (HCC)   . Opiate dependence (HCC)   . Substance abuse [facility](HCC)    Past Surgical History:  Procedure Laterality Date  . CESAREAN SECTION    . CHOLECYSTECTOMY     Family History: family history includes Diabetes in her father; Hypertension in her father and mother; Thyroid disease in her father. Social History:  reports that she has been smoking cigarettes and e-cigarettes.  She has never used smokeless tobacco. She reports that she has current or past drug history. Drug: Oxycodone. She reports that she does not drink alcohol.      Review of Systems  Constitutional: Negative.   Respiratory: Negative.   Cardiovascular: Negative.   Gastrointestinal: Negative.   Genitourinary: Negative.    History   Last menstrual period 11/13/2016. Exam Physical Exam  Constitutional: She appears well-developed and well-nourished.  Cardiovascular: Normal rate and regular rhythm.  Respiratory: Effort normal and breath sounds normal.  GI: Soft. Bowel sounds are normal.  Gravid FH=dates  Genitourinary:  Genitourinary Comments: deferred  Musculoskeletal:  Trace edema, nl DTR's    Prenatal labs: ABO, Rh: --/--/A POS Performed at [facility], [address] [place], [place] [address]  [phone](06/28 1150) Antibody: NEG (06/28 1138) Rubella: 2.46 (01/16 1003) RPR: Non Reactive (06/28 1150)  HBsAg: Negative (01/16 1003)  HIV: Non Reactive (04/17 0836)  GBS: Negative (06/10 1500)   Assessment/Plan: IUP 39  weeks Prior c section, desire for repeat H/O substance abuse, stable on Methadone AMA  The risks of cesarean section discussed with the patient included but were not limited to: bleeding which may require transfusion or reoperation; infection which may require antibiotics; injury to bowel, bladder, ureters or other surrounding organs; injury to the fetus; need for additional procedures including hysterectomy in the event of a life-threatening hemorrhage; placental abnormalities wth subsequent pregnancies, incisional problems, thromboembolic phenomenon and other postoperative/anesthesia complications. The patient concurred with the proposed plan, giving informed written consent for the procedure.   Anesthesia and OR aware. Preoperative prophylactic antibiotics and SCDs ordered on call to the OR.  To OR when ready.  Vicki Henault L. Alysia PennaErvin, MD     Vicki [name] 08/13/2017, 8:01 AM

## 2017-08-13 NOTE — Anesthesia Postprocedure Evaluation (Signed)
Anesthesia Post Note  Patient: Vicki GambleRobin T [name]  Procedure(s) Performed: REPEAT CESAREAN SECTION (N/A Abdomen)     Anesthesia Type: General    Last Vitals:  Vitals:   08/13/17 1051 08/13/17 1320  BP: 117/63 120/84  Pulse: 78 84  Resp: 18 12  Temp: 37 C 36.8 C  SpO2:  97%    Last Pain:  Vitals:   08/13/17 1320  TempSrc: Oral  PainSc:    Pain Goal:                 Trevor IhaStephen A Ikaika Showers

## 2017-08-14 LAB — CBC
HCT: 25.9 % — ABNORMAL LOW (ref 36.0–46.0)
Hemoglobin: 9.1 g/dL — ABNORMAL LOW (ref 12.0–15.0)
MCH: 29.3 pg (ref 26.0–34.0)
MCHC: 35.1 g/dL (ref 30.0–36.0)
MCV: 83.3 fL (ref 78.0–100.0)
PLATELETS: 148 10*3/uL — AB (ref 150–400)
RBC: 3.11 MIL/uL — AB (ref 3.87–5.11)
RDW: 13.9 % (ref 11.5–15.5)
WBC: 13.2 10*3/uL — ABNORMAL HIGH (ref 4.0–10.5)

## 2017-08-14 NOTE — Progress Notes (Signed)
POSTOPERATIVE DAY # 1 S/P RLTCS   S:         Reports feeling well             Tolerating po intake / No nausea / no vomiting /  Yes flatus / No BM             Bleeding is light             Pain controlled withibuprofen (OTC)             Up ad lib / ambulatory/ voiding QS  Newborn both feeding  / Circumcision : NA  Successfully breastfeeding? Yes    O:  VS: BP 113/67   Pulse 75   Temp 98.3 F (36.8 C) (Axillary)   Resp 18   Ht 5\' 7"  (1.702 m)   Wt 238 lb 11.2 oz (108.3 kg)   LMP 11/13/2016   SpO2 97%   Breastfeeding? Unknown   BMI 37.39 kg/m    LABS:               Recent Labs    08/13/17 1547 08/14/17 0528  WBC 16.7* 13.2*  HGB 10.1* 9.1*  PLT 144* 148*               Bloodtype: --/--/A POS Performed at [facility], [address] [place], [place] [address]  [phone](06/28 1150)  Rubella: 2.46 (01/16 1003)                     EBL:                          I&O: Intake/Output      06/30 0701 - 07/01 0700 07/01 0701 - 07/02 0700   P.O. 240    I.V. (mL/kg) 4440 (41)    IV Piggyback 500    Total Intake(mL/kg) 5180 (47.8)    Urine (mL/kg/hr) 1750    Blood 1621    Total Output 3371    Net +1809                      Physical Exam:             Alert and Oriented X3  Lungs: Clear and unlabored  Heart: regular rate and rhythm / no mumurs  Abdomen: soft, non-tender, non-distended              Fundus: firm, non-tender, U             Dressing clean dry and intact   Extremities: No edema, no calf pain or tenderness,   Reason for C/S  A:        POD # 2 S/P RLTCS              P:        Routine postoperative care              Anticipate discharge tomorrow    Vicki [name] CNM, MSN 08/14/2017, 8:53 AM

## 2017-08-14 NOTE — Addendum Note (Signed)
Addendum  created 08/14/17 0839 by Graciela HusbandsFussell, Quill Grinder O, CRNA   Sign clinical note

## 2017-08-14 NOTE — Lactation Note (Signed)
This note was copied from a baby's chart. Lactation Consultation Note  Patient Name: Vicki [name] VWUJW'JToday's Date: 08/14/2017 Reason for consult: Follow-up assessment;Term  Feeding Feeding Type: Breast Fed Length of feed: 5 min  LATCH Score Latch: Repeated attempts needed to sustain latch, nipple held in mouth throughout feeding, stimulation needed to elicit sucking reflex.  Audible Swallowing: A few with stimulation  Type of Nipple: Everted at rest and after stimulation(short nipple shaft)  Comfort (Breast/Nipple): Soft / non-tender  Hold (Positioning): Assistance needed to correctly position infant at breast and maintain latch.  LATCH Score: 7  Interventions Interventions: Breast feeding basics reviewed;Assisted with latch;Skin to skin;Breast massage;Hand express;Pre-pump if needed;Breast compression;Adjust position;Support pillows;Position options;Expressed milk;DEBP;Hand pump  Lactation Tools Discussed/Used Tools: Nipple Shields Nipple shield size: 20 Breast pump type: Double-Electric Breast Pump Pump Review: Setup, frequency, and cleaning;Milk Storage Initiated by:: Erby Pian Alam Guterrez RN IBCLC Date initiated:: 08/14/17   Consult Status Consult Status: Follow-up Date: 08/15/17 Follow-up type: In-patient    Vicki [name], [name] E 08/14/2017, 12:28 PM

## 2017-08-14 NOTE — Anesthesia Postprocedure Evaluation (Signed)
Anesthesia Post Note  Patient: Vicki GambleRobin T [name]  Procedure(s) Performed: REPEAT CESAREAN SECTION (N/A Abdomen)     Patient location during evaluation: [facility] Anesthesia Type: Spinal Level of consciousness: awake and alert Pain management: satisfactory to patient Vital Signs Assessment: post-procedure vital signs reviewed and stable Respiratory status: spontaneous breathing and respiratory function stable Cardiovascular status: stable Postop Assessment: adequate PO intake Anesthetic complications: no    Last Vitals:  Vitals:   08/14/17 0148 08/14/17 0537  BP:  113/67  Pulse:  75  Resp:  18  Temp:  36.8 C  SpO2: 97% 97%    Last Pain:  Vitals:   08/14/17 0540  TempSrc:   PainSc: 4    Pain Goal: Patients Stated Pain Goal: 4 (08/13/17 1740)               Annastasia Haskins

## 2017-08-14 NOTE — Addendum Note (Signed)
Addendum  created 08/14/17 [id number] by Graciela HusbandsFussell, Amalya Salmons O, CRNA   Sign clinical note

## 2017-08-14 NOTE — Lactation Note (Signed)
This note was copied from a baby's chart. Lactation Consultation Note  Patient Name: Vicki [name] WUJWJ'XToday's Date: 08/14/2017 Reason for consult: Follow-up assessment;Term difficult latch  Visited with P2 Mom of term baby at 5323 hrs old.  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  Baby to be observed for 4-5 days for NAS.    Baby noted to be jittery, and crying loudly while FOB changing diaper.  Offered to assist with positioning and latching baby.    Mom very agreeable to instruction.  Pre-pumped using hand pump to draw nipple out.  Colostrum flowing easily.  Baby unable to attain a deep latch, so initiated the 20 mm nipple shield.  Nipple pulled well in shield.  Mom needed assistance with hand placement, and need to firmly support breast.  Baby able to attain a semi-deep latch to breast.  Swallows identified for Mom in suck pattern.  Baby did need stimulation to continue sucking, but baby had just fed an hour prior.  Baby holding body tightly during most of feeding.  Placed baby STS on FOB, while assisted with double pumping.  Encouraged Mom to use breast massage and hand expression along with double pumping.  Plan of care- 1- Keep baby skin to skin as much as possible 2- breastfeed baby on cue often (Goal of 8-12 feedings per 24 hrs) 3- Use nipple shield, pre-pump to draw nipple out 4- pump both breasts on initiation setting, saving any EBM to feed back to baby. 5- may need formula supplementation    Feeding Feeding Type: Breast Fed Length of feed: 5 min  LATCH Score Latch: Repeated attempts needed to sustain latch, nipple held in mouth throughout feeding, stimulation needed to elicit sucking reflex.  Audible Swallowing: A few with stimulation  Type of Nipple: Everted at rest and after stimulation(short nipple shaft)  Comfort (Breast/Nipple): Soft / non-tender  Hold (Positioning): Assistance needed to correctly position infant at breast and maintain  latch.  LATCH Score: 7  Interventions Interventions: Breast feeding basics reviewed;Assisted with latch;Skin to skin;Breast massage;Hand express;Pre-pump if needed;Breast compression;Adjust position;Support pillows;Position options;Expressed milk;DEBP;Hand pump  Lactation Tools Discussed/Used Tools: Nipple Shields Nipple shield size: 20 Breast pump type: Double-Electric Breast Pump Pump Review: Setup, frequency, and cleaning;Milk Storage Initiated by:: [name] RN IBCLC Date initiated:: 08/14/17   Consult Status Consult Status: Follow-up Date: 08/15/17 Follow-up type: In-patient    Vicki [name], Vicki [name] E 08/14/2017, 12:08 PM

## 2017-08-14 NOTE — Progress Notes (Signed)
CSW acknowledges consult.  CSW attempted to meet with [name], however [name] and infant were being assisted by lactation consultant.  CSW will attempt to visit with [name] at a later time.   Blaine HamperAngel Boyd-Gilyard, MSW, LCSW Clinical Social Work [phone]

## 2017-08-15 NOTE — Progress Notes (Signed)
Patient ID: Vicki [name], female   DOB: 01-Nov-1980, 37 y.o.   MRN: [id number] POSTPARTUM PROGRESS NOTE  POD #2  Subjective:  Vicki [name] is a 37 y.o. [id number] s/p RLTCS at 6469w0d.  She reports she doing well. No acute events overnight. She reports she is doing well. She denies any problems with ambulating, voiding or po intake. Denies nausea or vomiting. She has passed flatus. Pain is well controlled.  Lochia is light.  Female: bottlefeeding with pumped breast milk / Circumsicion: N/A -- on phototherapy  Objective: Blood pressure 113/73, pulse 75, temperature 97.9 F (36.6 C), temperature source Oral, resp. rate 18, height 5\' 7"  (1.702 m), weight 238 lb 11.2 oz (108.3 kg), last menstrual period 11/13/2016, SpO2 97 %.  Physical Exam:  General: alert, cooperative and no distress Chest: no respiratory distress Heart:regular rate, distal pulses intact Abdomen: soft, nontender,  Uterine Fundus: firm, appropriately tender, U-2 DVT Evaluation: No calf swelling or tenderness Extremities: Mild edema Skin: warm, dry; incision approximated with sutures / no erythema / no ecchymosis / no drainage, clean/dry/intact w/ honeycomb dressing in place   Recent Labs    08/13/17 1547 08/14/17 0528  HGB 10.1* 9.1*  HCT 28.7* 25.9*    Assessment/Plan: Vicki [name] is a 37 y.o. [id number] s/p RLTCS at 7769w0d.  POD#2 - Doing welll; pain well-controlled. H/H appropriate  Routine postpartum care  OOB, ambulated  Lovenox for VTE prophylaxis Post-operative Anemia: asymptomatic  Contraception: OCP Feeding: breast/bottle  Dispo: Plan for discharge tomorrow.   LOS: 2 days   Raelyn Moraolitta Selenne Coggin, CNM 08/15/2017, 10:51 AM

## 2017-08-15 NOTE — Lactation Note (Signed)
This note was copied from a baby's chart. Lactation Consultation Note  Patient Name: Vicki [name] NWGNF'AToday's Date: 08/15/2017 Reason for consult: Follow-up assessment;Difficult latch Mom attempting to latch baby to breast using a 20 mm nipple shield.  Baby pulling off and on breast for a few minutes then fell asleep.  Baby has been supplemented with expressed milk and formula per bottle.  Mom is pumping 10-15 mls.  She wasn't sure how often to pump.  Instructed to pump 8-12 times/24 hours.  Encouraged to call for assist/concerns prn.  Maternal Data    Feeding Feeding Type: Breast Fed Length of feed: 5 min  LATCH Score Latch: Repeated attempts needed to sustain latch, nipple held in mouth throughout feeding, stimulation needed to elicit sucking reflex.  Audible Swallowing: None  Type of Nipple: Flat  Comfort (Breast/Nipple): Soft / non-tender  Hold (Positioning): No assistance needed to correctly position infant at breast.  LATCH Score: 6  Interventions    Lactation Tools Discussed/Used Tools: Nipple Shields Nipple shield size: 20   Consult Status Consult Status: Follow-up Date: 08/16/17    Huston FoleyMOULDEN, Burtis Imhoff S 08/15/2017, 2:56 PM

## 2017-08-15 NOTE — Progress Notes (Signed)
Patient told plan of care . Patient told about pain meds . Patient told to ambulate halls and use incentive spirometer. Patient encouraged to feed infant per guidelines and pump. Skin to skin encouraged , infant seems consolable after feedings for now. Will monitor.

## 2017-08-16 ENCOUNTER — Other Ambulatory Visit: Payer: Self-pay

## 2017-08-16 MED ORDER — IBUPROFEN 600 MG PO TABS: 600.0000 mg | ORAL_TABLET | Freq: Four times a day (QID) | ORAL | 0 refills | Status: DC | PRN

## 2017-08-16 MED ORDER — OXYCODONE-ACETAMINOPHEN 5-325 MG PO TABS: 1.0000 | ORAL_TABLET | Freq: Four times a day (QID) | ORAL | 0 refills | Status: AC | PRN

## 2017-08-16 NOTE — Discharge Summary (Addendum)
OB Discharge Summary    Patient Name: Vicki [name] DOB: 12-11-1980 MRN: [id number] Date of admission: 08/13/2017  Delivering MD: Hermina StaggersERVIN, MICHAEL L )  Date of discharge: 08/16/2017  Admitting diagnosis: scheduled repeat Csection at 39wk Intrauterine pregnancy: 5790w0d    Secondary diagnosis:  Active Problems:   Patient Active Problem List   Diagnosis Date Noted  . Post-operative state 08/13/2017  . Subclinical hyperthyroidism 03/06/2017  . Proteinuria in pregnancy, antepartum 03/02/2017  . Obesity (BMI 30-39.9) 03/01/2017  . Obesity in pregnancy 03/01/2017  . Methadone maintenance therapy patient (HCC) 03/01/2017  . Supervision of high risk pregnancy, antepartum, second trimester 01/27/2017  . Previous cesarean delivery, antepartum 01/27/2017  . H/O degenerative disc disease 01/27/2017  . AMA (advanced maternal age) multigravida 35+ 01/27/2017   Additional problems: H/o substance use, stable on methadone     Discharge diagnosis: Term Pregnancy Delivered                                                                                                Post partum procedures:none  Complications: None  Hospital course:  Sceduled C/S   37 y.o. yo [id number] at 5490w0d was admitted to the hospital 08/13/2017 for scheduled cesarean section with the following indication:Elective Repeat.  Membrane Rupture Time/Date: 12:20 PM ,08/13/2017   Patient delivered a Viable infant.08/13/2017  Details of operation can be found in separate operative note, although with EBL > 1000.  Patient had an uncomplicated postpartum course. She was continued on her home methadone without complications. She is ambulating, tolerating a regular diet, passing flatus, and urinating well. Patient is discharged home in stable condition on  08/16/17.        Physical exam  Vitals:   08/15/17 2209 08/16/17 0603  BP: 110/68 115/74  Pulse: 79 81  Resp: 18 18  Temp: 97.6 F (36.4 C) 98.1 F (36.7 C)  SpO2:      General: alert,  cooperative and no distress Lochia: appropriate Uterine Fundus: firm Incision: Honeycomb dressing overlying is dry and intact with minimal saturation. DVT Evaluation: No evidence of DVT seen on physical exam.  Labs: No results found for this or any previous visit (from the past 24 hour(s)).   Discharge instruction: per After Visit Summary and "Baby and Me Booklet".  After visit meds:  Allergies  Allergen Reactions  . Amoxicillin Hives    Has patient had a PCN reaction causing immediate rash, facial/tongue/throat swelling, SOB or lightheadedness with hypotension: No Has patient had a PCN reaction causing severe rash involving mucus membranes or skin necrosis: No Has patient had a PCN reaction that required hospitalization: No Has patient had a PCN reaction occurring within the last 10 years: No If all of the above answers are "NO", then may proceed with Cephalosporin use.   [name]. Strawberry (Diagnostic) Hives  . Tramadol Hives    Allergies as of 08/16/2017      Reactions   Amoxicillin Hives   Has patient had a PCN reaction causing immediate rash, facial/tongue/throat swelling, SOB or lightheadedness with hypotension: No Has patient had a PCN reaction causing severe rash involving mucus  membranes or skin necrosis: No Has patient had a PCN reaction that required hospitalization: No Has patient had a PCN reaction occurring within the last 10 years: No If all of the above answers are "NO", then may proceed with Cephalosporin use.   Strawberry (diagnostic) Hives   Tramadol Hives      Medication List    STOP taking these medications   Doxylamine-Pyridoxine 10-10 MG Tbec Commonly known as:  DICLEGIS     TAKE these medications   ALPRAZolam 1 MG tablet Commonly known as:  XANAX Take 1 mg by mouth 2 (two) times daily as needed for anxiety.   ibuprofen 600 MG tablet Commonly known as:  ADVIL,MOTRIN Take 1 tablet (600 mg total) by mouth every 6 (six) hours as needed.   methadone 10  MG tablet Commonly known as:  DOLOPHINE Take 10 mg by mouth 5 (five) times daily.   Oxycodone HCl 10 MG Tabs Take 10 mg by mouth 3 (three) times daily.   Prenatal Vitamins 0.8 MG tablet Take 1 tablet by mouth daily.      Diet: routine diet  Activity: Advance as tolerated. Pelvic rest for 6 weeks.   Outpatient follow up: 2 weeks for skin incision check, 6 weeks for postpartum Future Appointments: No future appointments.  Follow up Appt: No follow-ups on file.  Postpartum contraception: Combination OCPs  Newborn Data: APGAR (1 MIN): 8   APGAR (5 MINS): 9    Baby Feeding: Bottle and Breast Disposition:home with mother  Ellwood Dense, DO 08/16/2017  I spoke with and examined patient and agree with resident/PA/SNM's note and plan of care.  Breast and bottlefeeding, plans POPs. Eating, drinking, voiding, ambulating well.  +flatus.  Lochia and pain wnl. Honeycomb dressing w/ some old drainage. FF. Denies dizziness, lightheadedness, or sob. No complaints. D/C home on regular methadone dose and percocet prn severe pain. 2wk incision check, then 6wks for pp visit. Parents state social worker saw them yesterday- no note in Epic.  Cheral Marker, CNM, [facility] 08/16/2017 7:09 AM

## 2017-08-16 NOTE — Discharge Instructions (Signed)

## 2017-08-16 NOTE — Clinical Social Work Maternal (Signed)
CLINICAL SOCIAL WORK MATERNAL/CHILD NOTE  Patient Details  Name: Vicki [name] MRN: [id number] Date of Birth: 01/02/1981  Date:  08/16/2017  Clinical Social Worker Initiating Note:  Charly Holcomb Boyd-Gilyard Date/Time: Initiated:  08/14/17/1055     Child's Name:  Vicki [name]   Biological Parents:  Mother, Father   Need for Interpreter:  None   Reason for Referral:  Behavioral Health Concerns, Current Substance Use/Substance Use During Pregnancy    Address:  [address] [place] [address]    Phone number:  [phone] (home)     Additional phone number:   Household Members/Support Persons (HM/SP):   Household Member/Support Person 1, Household Member/Support Person 2   HM/SP Name Relationship DOB or Age  HM/SP -1 Vicki [name] FOB 04/15/1975  HM/SP -2 Vicki [name] daugher 11/08/2006  HM/SP -3        HM/SP -4        HM/SP -5        HM/SP -6        HM/SP -7        HM/SP -8          Natural Supports (not living in the home):  Extended Family, Parent, Immediate Family   Professional Supports: Case Manager/Social Worker([name] received MAT with [facility])   Employment: Self-employed   Type of Work: House Keeper   Education:      Homebound arranged:    Financial Resources:  Medicaid   Other Resources:  WIC([name] plans to apply for Food Stamps.)   Cultural/Religious Considerations Which May Impact Care:  Per [name] Face Sheet, [name] is Non-denominational. Strengths:  Ability to meet basic needs , Compliance with medical plan , Home prepared for child , Pediatrician chosen, Psychotropic Medications, Understanding of illness   Psychotropic Medications:  Methadone, Xanax      Pediatrician:    (Infant will receive pediatric follow-up with [facility].)  Pediatrician List:   [place]    [place]    [place]    [place]    [place]    [place]      Pediatrician Fax Number:    Risk Factors/Current  Problems:  Compliance with Treatment , Substance Use , Mental Health Concerns    Cognitive State:  Alert , Able to Concentrate , Insightful , Linear Thinking    Mood/Affect:  Happy , Interested , Relaxed , Comfortable , Bright    CSW Assessment: CSW met with [name] to complete an assessment for substance exposed infant.  When CSW arrived, [name] was in bed bonding with infant as evidence by engaging in breastfeeding. FOB was also present and was observing [name] and infant's interactions. FOB appeared supportive of [name] and was helpful with caring for infant. [name] gave CSW permission to complete the assessment while FOB was present.  [name] was relaxed, easy to engage, and receptive to meeting with CSW.    CSW asked about [name] MAT and [name] openly shared that [name] takes Methadone for pain management and her medication is managed by  [facility]. [name] reported being an established patient for the past "few years."  CSW explained hospital policy regarding substance exposed due to MAT and [name] was understanding.  [name] denied the use of all illicit substance and did not appear concerned.  CSW made [name] aware the infant's UDS was positive for Benzodiazepines and that CSW understands that [name] has a Rx for Xanax.  [name] is aware that if infant's CDS is   positive without an explanation, CSW will make a report to [place] CPS; [name] was understanding.   CSW asked about [name] MH hx and [name] openly shared a hx of anxiety and PTSD.  [name] reported that [name] Xanax is prescribed by Dr. Olls at [facility] and [name] next appointment is 08/29/2017.   CSW provided education regarding the baby blues period vs. perinatal mood disorders, discussed treatment and gave resources for mental health follow up if concerns arise.  CSW recommends self-evaluation during the postpartum time period using the New Mom Checklist from Postpartum Progress and encouraged [name] to contact a medical professional if symptoms  are noted at any time. [name] not did not present with any acute symptoms and denied SI and HI.    CSW provided review of Sudden Infant Death Syndrome (SIDS) precautions.    CSW identifies no further need for intervention and no barriers to discharge at this time.  CSW Plan/Description:  No Further Intervention Required/No Barriers to Discharge, Sudden Infant Death Syndrome (SIDS) Education, Neonatal Abstinence Syndrome (NAS) Education, Other Information/Referral to Community Resources, Hospital Drug Screen Policy Information, CSW Will Continue to Monitor Umbilical Cord Tissue Drug Screen Results and Make Report if Warranted, Perinatal Mood and Anxiety Disorder (PMADs) Education   Chrisie Jankovich Boyd-Gilyard, MSW, LCSW Clinical Social Work [phone]   Jebediah Macrae D BOYD-GILYARD, LCSW 08/16/2017, 11:02 AM 

## 2017-08-16 NOTE — Lactation Note (Signed)
This note was copied from a baby's chart. Lactation Consultation Note  Patient Name: Vicki [name] RUEAV'WToday's Date: 08/16/2017 Reason for consult: Follow-up assessment;1st time breastfeeding;Primapara;Difficult latch  Visited with Mom, FOB, and GMOB, baby 6073 hrs old.  FOB very anxious about going home.  Lots of questions about pumps.  [facility]  Mom states she has WIC with [facility].  Mom interested in obtaining a DEBP loaner.  FOB told Mom to "not worry about it", as he was going to buy one.  Talked to couple about importance of a strong hospital grade pump for the early weeks while establishing a milk supply.   Mom pumping every 3 hrs, and milk volume coming in.  Obtained 40 ml last pumping.  GMOB bottle feeding baby.  Reviewed how to pace bottle feed to simulate more the flow of milk from the breast.  Encouraged STS as much as possible.  Encouraged double pumping every 2-3 hrs, on regular setting for 15-20 mins.  Talked about breast massage and hand expression along with double pumping.    Mom to [place] [facility] [place]IC about obtaining a DEBP on discharge.   Consult Status Consult Status: Follow-up Date: 08/17/17 Follow-up type: In-patient    [name], [name] 08/16/2017, 1:54 PM

## 2017-08-25 ENCOUNTER — Encounter (HOSPITAL_COMMUNITY): Payer: Self-pay

## 2017-08-25 ENCOUNTER — Other Ambulatory Visit: Payer: Self-pay

## 2017-08-25 ENCOUNTER — Inpatient Hospital Stay (HOSPITAL_COMMUNITY)
Admission: RE | Admit: 2017-08-25 | Discharge: 2017-08-30 | DRG: 881 | Disposition: A | Discharge status: Home / Self Care | Payer: Medicaid Other | Attending: Psychiatry | Admitting: Psychiatry

## 2017-08-25 DIAGNOSIS — F17210 Nicotine dependence, cigarettes, uncomplicated: Secondary | ICD-10-CM | POA: Diagnosis not present

## 2017-08-25 DIAGNOSIS — F419 Anxiety disorder, unspecified: Secondary | ICD-10-CM | POA: Diagnosis present

## 2017-08-25 DIAGNOSIS — F329 Major depressive disorder, single episode, unspecified: Secondary | ICD-10-CM | POA: Diagnosis present

## 2017-08-25 DIAGNOSIS — Z6379 Other stressful life events affecting family and household: Secondary | ICD-10-CM

## 2017-08-25 DIAGNOSIS — F322 Major depressive disorder, single episode, severe without psychotic features: Secondary | ICD-10-CM | POA: Diagnosis present

## 2017-08-25 DIAGNOSIS — Z91018 Allergy to other foods: Secondary | ICD-10-CM

## 2017-08-25 DIAGNOSIS — Z9889 Other specified postprocedural states: Secondary | ICD-10-CM | POA: Diagnosis not present

## 2017-08-25 DIAGNOSIS — Z885 Allergy status to narcotic agent status: Secondary | ICD-10-CM | POA: Diagnosis not present

## 2017-08-25 DIAGNOSIS — F17290 Nicotine dependence, other tobacco product, uncomplicated: Secondary | ICD-10-CM | POA: Diagnosis present

## 2017-08-25 DIAGNOSIS — E0590 Thyrotoxicosis, unspecified without thyrotoxic crisis or storm: Secondary | ICD-10-CM | POA: Diagnosis present

## 2017-08-25 DIAGNOSIS — M549 Dorsalgia, unspecified: Secondary | ICD-10-CM

## 2017-08-25 DIAGNOSIS — Z880 Allergy status to penicillin: Secondary | ICD-10-CM | POA: Diagnosis not present

## 2017-08-25 DIAGNOSIS — G4700 Insomnia, unspecified: Secondary | ICD-10-CM | POA: Diagnosis not present

## 2017-08-25 DIAGNOSIS — F53 Postpartum depression: Principal | ICD-10-CM | POA: Diagnosis present

## 2017-08-25 DIAGNOSIS — Z765 Malingerer [conscious simulation]: Secondary | ICD-10-CM

## 2017-08-25 HISTORY — DX: Anxiety disorder, unspecified: F41.9

## 2017-08-25 HISTORY — DX: Major depressive disorder, single episode, unspecified: F32.9

## 2017-08-25 LAB — URINALYSIS, COMPLETE (UACMP) WITH MICROSCOPIC
BACTERIA UA: NONE SEEN
BILIRUBIN URINE: NEGATIVE
GLUCOSE, UA: NEGATIVE mg/dL
Ketones, ur: NEGATIVE mg/dL
NITRITE: NEGATIVE
Protein, ur: NEGATIVE mg/dL
SPECIFIC GRAVITY, URINE: 1.02 (ref 1.005–1.030)
pH: 7 (ref 5.0–8.0)

## 2017-08-25 LAB — RAPID URINE DRUG SCREEN, HOSP PERFORMED
Amphetamines: NOT DETECTED
Benzodiazepines: NOT DETECTED
COCAINE: NOT DETECTED
OPIATES: NOT DETECTED
TETRAHYDROCANNABINOL: NOT DETECTED

## 2017-08-25 MED ORDER — SENNOSIDES-DOCUSATE SODIUM 8.6-50 MG PO TABS
1.00 | ORAL_TABLET | ORAL | Status: DC
Start: 2017-08-22 — End: 2017-08-25

## 2017-08-25 MED ORDER — OLANZAPINE 10 MG PO TBDP
10.0000 mg | ORAL_TABLET | Freq: Two times a day (BID) | ORAL | Status: DC | PRN
Start: 2017-08-25 — End: 2017-08-30
  Administered 2017-08-28: 10 mg via ORAL
  Filled 2017-08-25: qty 1

## 2017-08-25 MED ORDER — NALOXONE HCL 0.4 MG/ML IJ SOLN
0.10 | INTRAMUSCULAR | Status: DC
Start: ? — End: 2017-08-25

## 2017-08-25 MED ORDER — HYDROXYZINE HCL 25 MG PO TABS
25.0000 mg | ORAL_TABLET | Freq: Three times a day (TID) | ORAL | Status: DC | PRN
  Administered 2017-08-25 – 2017-08-26 (×2): 25 mg via ORAL
  Filled 2017-08-25 (×2): qty 1

## 2017-08-25 MED ORDER — ALUM & MAG HYDROXIDE-SIMETH 200-200-20 MG/5ML PO SUSP: 30.0000 mL | ORAL | Status: DC | PRN

## 2017-08-25 MED ORDER — NALOXONE HCL 0.4 MG/ML IJ SOLN
0.40 | INTRAMUSCULAR | Status: DC
Start: ? — End: 2017-08-25

## 2017-08-25 MED ORDER — TRAZODONE HCL 50 MG PO TABS
50.0000 mg | ORAL_TABLET | Freq: Every evening | ORAL | Status: DC | PRN
  Administered 2017-08-25 – 2017-08-28 (×6): 50 mg via ORAL
  Filled 2017-08-25 (×16): qty 1

## 2017-08-25 MED ORDER — TRAZODONE HCL 50 MG PO TABS: 50.0000 mg | ORAL_TABLET | Freq: Every evening | ORAL | Status: DC | PRN

## 2017-08-25 MED ORDER — BUPROPION HCL ER (XL) 150 MG PO TB24
150.0000 mg | ORAL_TABLET | Freq: Every day | ORAL | Status: DC
  Administered 2017-08-26 – 2017-08-28 (×3): 150 mg via ORAL
  Filled 2017-08-25 (×5): qty 1

## 2017-08-25 MED ORDER — METHADONE HCL 10 MG PO TABS
10.0000 mg | ORAL_TABLET | Freq: Every day | ORAL | Status: DC
Start: 2017-08-25 — End: 2017-08-25
  Administered 2017-08-25: 10 mg via ORAL
  Filled 2017-08-25: qty 1

## 2017-08-25 MED ORDER — METHADONE HCL 5 MG PO TABS
25.0000 mg | ORAL_TABLET | Freq: Two times a day (BID) | ORAL | Status: DC
  Administered 2017-08-26 – 2017-08-30 (×9): 25 mg via ORAL
  Filled 2017-08-25: qty 2
  Filled 2017-08-25: qty 1
  Filled 2017-08-25 (×4): qty 2
  Filled 2017-08-25 (×3): qty 1

## 2017-08-25 MED ORDER — MAGNESIUM HYDROXIDE 400 MG/5ML PO SUSP
30.0000 mL | Freq: Every day | ORAL | Status: DC | PRN
  Administered 2017-08-26 – 2017-08-29 (×3): 30 mL via ORAL
  Filled 2017-08-25 (×3): qty 30

## 2017-08-25 MED ORDER — IBUPROFEN 600 MG PO TABS
600.0000 mg | ORAL_TABLET | Freq: Four times a day (QID) | ORAL | Status: DC | PRN
  Administered 2017-08-26 – 2017-08-28 (×5): 600 mg via ORAL
  Filled 2017-08-25 (×5): qty 1

## 2017-08-25 MED ORDER — NICOTINE POLACRILEX 2 MG MT GUM
2.0000 mg | CHEWING_GUM | OROMUCOSAL | Status: DC | PRN
  Administered 2017-08-26 – 2017-08-27 (×3): 2 mg via ORAL
  Filled 2017-08-25 (×2): qty 1

## 2017-08-25 MED ORDER — MELATONIN 1 MG PO TABS
1.0000 mg | ORAL_TABLET | Freq: Every day | ORAL | Status: DC
  Administered 2017-08-26 – 2017-08-28 (×3): 1 mg via ORAL
  Filled 2017-08-25 (×6): qty 1

## 2017-08-25 NOTE — BHH Group Notes (Signed)
LCSW Group Therapy Note  08/25/2017 1:15pm  Type of Therapy/Topic:  Group Therapy:  Feelings about Diagnosis  Participation Level:  Minimal   Description of Group:   This group will allow patients to explore their thoughts and feelings about diagnoses they have received. Patients will be guided to explore their level of understanding and acceptance of these diagnoses. Facilitator will encourage patients to process their thoughts and feelings about the reactions of others to their diagnosis and will guide patients in identifying ways to discuss their diagnosis with significant others in their lives. This group will be process-oriented, with patients participating in exploration of their own experiences, giving and receiving support, and processing challenge from other group members.   Therapeutic Goals: 1. Patient will demonstrate understanding of diagnosis as evidenced by identifying two or more symptoms of the disorder 2. Patient will be able to express two feelings regarding the diagnosis 3. Patient will demonstrate their ability to communicate their needs through discussion and/or role play  Summary of Patient Progress:  Vicki [name] appropriately introduced herself, and asked a few questions about the routine.  Later, she appeared to be nervous, and went and got some paper to write in, spending the rest of the time busily writing.  She appeared distracted, guarded.      Therapeutic Modalities:   Cognitive Behavioral Therapy Brief Therapy Feelings Identification    Vicki RogueRodney B Mikael Skoda, LCSW 08/25/2017 4:56 PM
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
## 2017-08-25 NOTE — Tx Team (Signed)
Initial Treatment Plan 08/25/2017 2:38 PM Vicki GambleRobin T [name] UJW:[id number]    PATIENT STRESSORS: Financial difficulties Health problems Occupational concerns Other: new baby   PATIENT STRENGTHS: Ability for insight Active sense of humor Capable of independent living Communication skills Motivation for treatment/growth Religious Affiliation Supportive family/friends   PATIENT IDENTIFIED PROBLEMS: "work on bettering myself"  "being more involved with positive things and working on coping skills"                   DISCHARGE CRITERIA:  Ability to meet basic life and health needs Improved stabilization in mood, thinking, and/or behavior  PRELIMINARY DISCHARGE PLAN: Return to previous living arrangement  PATIENT/FAMILY INVOLVEMENT: This treatment plan has been presented to and reviewed with the patient, Vicki [name].  The patient and family have been given the opportunity to ask questions and make suggestions.  Raylene MiyamotoMichael R Cassie Henkels, RN 08/25/2017, 2:38 PM
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
## 2017-08-25 NOTE — [facility] Group Notes (Signed)
Adult Psychoeducational Group Note  Date:  08/25/2017 Time:  8:54 PM  Group Topic/Focus:  Wrap-Up Group:   The focus of this group is to help patients review their daily goal of treatment and discuss progress on daily workbooks.  Participation Level:  Did Not Attend  Participation Quality:  Did not attend  Affect:  Did  not attend  Cognitive:  Did not attend  Insight: None  Engagement in Group:  Did not attend  Modes of Intervention:  Did not attend  Additional Comments:  Did not attend  Lyndee HensenGoins, Sobia Karger R 08/25/2017, 8:54 PM

## 2017-08-25 NOTE — H&P (Signed)
Behavioral Health Medical Screening Exam  Vicki GambleRobin T [name] is an 37 y.o. female.  Total Time spent with patient: 20 minutes  Psychiatric Specialty Exam: Physical Exam  Nursing note and vitals reviewed. Constitutional: She is oriented to person, place, and time. She appears well-developed and well-nourished.  Cardiovascular: Normal rate.  Respiratory: Effort normal.  Musculoskeletal: Normal range of motion.  Neurological: She is alert and oriented to person, place, and time.  Skin: Skin is warm.    Review of Systems  Constitutional: Negative.   HENT: Negative.   Eyes: Negative.   Respiratory: Negative.   Cardiovascular: Negative.   Gastrointestinal: Negative.   Genitourinary: Negative.   Musculoskeletal: Positive for back pain and joint pain.  Skin: Negative.   Neurological: Negative.   Endo/Heme/Allergies: Negative.   Psychiatric/Behavioral: Positive for depression, memory loss and suicidal ideas. The patient is nervous/anxious and has insomnia.     Blood pressure 126/82, pulse 71, temperature 98.3 F (36.8 C), resp. rate 16, last menstrual period 11/13/2016, SpO2 100 %, unknown if currently breastfeeding.There is no height or weight on file to calculate BMI.  General Appearance: Casual  Eye Contact:  Good  Speech:  Clear and Coherent and Slow  Volume:  Normal  Mood:  Depressed  Affect:  Flat  Thought Process:  Linear and Descriptions of Associations: Intact  Orientation:  Full (Time, Place, and Person)  Thought Content:  WDL  Suicidal Thoughts:  Yes.  without intent/plan  Homicidal Thoughts:  Yes.  without intent/plan  Memory:  Immediate;   Fair Recent;   Fair Remote;   Poor  Judgement:  Poor  Insight:  Lacking  Psychomotor Activity:  Normal  Concentration: Concentration: Fair and Attention Span: Fair  Recall:  [employer]Fair  Fund of Knowledge:Fair  Language: Good  Akathisia:  No  Handed:  Right  AIMS (if indicated):     Assets:  Communication Skills Desire for  Improvement Financial Resources/Insurance Housing Physical Health Social Support Transportation  Sleep:       Musculoskeletal: Strength & Muscle Tone: within normal limits Gait & Station: normal Patient leans: N/A  Blood pressure 126/82, pulse 71, temperature 98.3 F (36.8 C), resp. rate 16, last menstrual period 11/13/2016, SpO2 100 %, unknown if currently breastfeeding.  Recommendations:  Based on my evaluation the patient does not appear to have an emergency medical condition.  Gerlene Burdockravis B Valiant Dills, FNP 08/25/2017, 11:20 AM

## 2017-08-25 NOTE — H&P (Signed)
Psychiatric Admission Assessment Adult  Patient Identification: Vicki [name] MRN:  [id number] Date of Evaluation:  08/25/2017 Chief Complaint:  MDD Principal Diagnosis: MDD (major depressive disorder), severe (HCC) Diagnosis:   Patient Active Problem List   Diagnosis Date Noted  . MDD (major depressive disorder), severe (HCC) [F32.2] 08/25/2017  . Post-operative state [Z98.890] 08/13/2017  . 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 Proteinuria in pregnancy, antepartum [O12.10] 03/02/2017  . Obesity (BMI 30-39.9) [E66.9] 03/01/2017  . Obesity in pregnancy [O99.210] 03/01/2017  . 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 Supervision of high risk pregnancy, antepartum, second trimester [O09.92] 01/27/2017  . Previous cesarean delivery, antepartum [O34.219] 01/27/2017  . H/O degenerative disc disease [Z87.39] 01/27/2017  . AMA (advanced maternal age) multigravida 35+ [O09.529] 01/27/2017   History of Present Illness: (Per [facility] Assessment Note) Vicki [name] is a 37 y.o. female who was brought to [facility] by her fiance due to ongoing mental health difficulties she's been having since she was discharged from the hospital after the birth of her daughter on 08/13/17. Pt's fiance states they took pt to the hospital on 08/23/17 and she was transferred to [facility] and kept overnight for observation, but that nothing changed. Pt's fiance states things have continued to get worse, so they decided pt should come to [facility] today for an assessment to determine what was wrong. Pt's fiance shares pt has also been irritable, angry, and hostile and expressed wanting to kill him; pt expresses no HI, though she does identify that she's gotten mad at times. Pt's fiance states pt has been very possessive, including over the baby, and that pt will refuse to let him have the baby if the baby needs to be changed. He states pt also unbuckled the baby's car seat while he  was driving down the highway, which was very scary. He shares pt was trying to jump out of the car on the way to the hospital. Patient confirms she wanted to get out of the car because Fiance kept talking so much. She has also been confused at times, stating doctors at the hospital the day prior looked to her like cartoon characters, and she has had difficulty remembering people's names (including his).  On evaluation today, at first contact, patient is non-verbal, makes no eye contact, fixated on writing the names of friends and family on piece of paper. On subsequent encounter, Patient is alert & orientedx3, states she wanted to just come here [facility]) to get some rest. Patient confirms having post-partum depression with first child in 2008. With this baby, she has not slept for almost 2 weeks since having her baby. She states is appetite is good. Patient is distractible, Speech is normal, but thought process is circumstantial. Memory & Recall is intact. Mood is dysthymic, Affect is somewhat labile. She denies SIHI, AVH, with no delusions, no paranoia, is not responding to internal stimuli. She hopes that she can become organized during this hospital stay.  Reviewed records and noted that hospitalization at [facility] was for suspected overdose and patient was in MICU prior to discharge with instruction to f/u with outpatient psychiatry.  Patient minimizes symptoms and denies overdose. She does exhibit drug seeking behaviors requesting diazepam.  She states off BZD'z x 2 weeks.  Review of [place] prescriber database confirms methadone dosages.  Associated Signs/Symptoms: Depression Symptoms:  fatigue, difficulty concentrating, anxiety, disturbed sleep, (Hypo) Manic Symptoms:  Distractibility, Impulsivity, Irritable Mood, Labiality of Mood, decreased sleep Anxiety  Symptoms:  Excessive Worry, Psychotic Symptoms:  None PTSD Symptoms: Negative Total Time spent with patient: 1 hour  Past Psychiatric  History:  Post-partum Depression Opiate dependence Substance Abuse Anxiety  Is the patient at risk to self? No.  Has the patient been a risk to self in the past 6 months? Yes.    Has the patient been a risk to self within the distant past? Yes.    Is the patient a risk to others? No.  Has the patient been a risk to others in the past 6 months? No.  Has the patient been a risk to others within the distant past? No.   Prior Inpatient Therapy: Prior Inpatient Therapy: No Prior Outpatient Therapy: Prior Outpatient Therapy: Yes Prior Therapy Dates: Age 38 - 11 Prior Therapy Facilty/Provider(s): A provider in [place] Reason for Treatment: Depression Does patient have an ACCT team?: No Does patient have Intensive In-House Services?  : No Does patient have [facility] services? : No Does patient have P4CC services?: No  Alcohol Screening: 1. How often do you have a drink containing alcohol?: Never 2. How many drinks containing alcohol do you have on a typical day when you are drinking?: 1 or 2 3. How often do you have six or more drinks on one occasion?: Never AUDIT-C Score: 0 4. How often during the last year have you found that you were not able to stop drinking once you had started?: Never 5. How often during the last year have you failed to do what was normally expected from you becasue of drinking?: Never 6. How often during the last year have you needed a first drink in the morning to get yourself going after a heavy drinking session?: Never 7. How often during the last year have you had a feeling of guilt of remorse after drinking?: Never 8. How often during the last year have you been unable to remember what happened the night before because you had been drinking?: Never 9. Have you or someone else been injured as a result of your drinking?: No 10. Has a relative or friend or a doctor or another health worker been concerned about your drinking or suggested you cut down?: No Alcohol Use  Disorder Identification Test Final Score (AUDIT): 0 Substance Abuse History in the last 12 months:  Yes.   Consequences of Substance Abuse: Medical Consequences:  hospitalization with OD Legal Consequences:  CPS involved with first child Previous Psychotropic Medications: Yes  Psychological Evaluations: No  Past Medical History:  Past Medical History:  Diagnosis Date  . Anxiety   . Back pain   . Degenerative disc disease, lumbar   . History of ectopic pregnancy   . Methadone maintenance therapy patient (HCC)   . Opiate dependence (HCC)   . Substance abuse [facility])     Past Surgical History:  Procedure Laterality Date  . CESAREAN SECTION    . CESAREAN SECTION N/A 08/13/2017   Procedure: REPEAT CESAREAN SECTION;  Surgeon: Hermina Staggers, MD;  Location: [facility] BIRTHING SUITES;  Service: Obstetrics;  Laterality: N/A;  . CHOLECYSTECTOMY     Family History:  Family History  Problem Relation Age of Onset  . Hypertension Mother   . Thyroid disease Father   . Hypertension Father   . Diabetes Father    Family Psychiatric  History: unknown Tobacco Screening:   Social History:  Social History   Substance and Sexual Activity  Alcohol Use No  . Frequency: Never     Social History  Substance and Sexual Activity  Drug Use Yes  . Types: Oxycodone   Comment: methadone    Additional Social History: Marital status: Long term relationship    Pain Medications: Please see MAR Prescriptions: Please see MAR Over the Counter: Please see MAR History of alcohol / drug use?: No history of alcohol / drug abuse Longest period of sobriety (when/how long): 6 years                    Allergies:   Allergies  Allergen Reactions  . Amoxicillin Hives    Has patient had a PCN reaction causing immediate rash, facial/tongue/throat swelling, SOB or lightheadedness with hypotension: No Has patient had a PCN reaction causing severe rash involving mucus membranes or skin necrosis: No Has  patient had a PCN reaction that required hospitalization: No Has patient had a PCN reaction occurring within the last 10 years: No If all of the above answers are "NO", then may proceed with Cephalosporin use.   [name]. Strawberry (Diagnostic) Hives  . Tramadol Hives   Lab Results: No results found for this or any previous visit (from the past 48 hour(s)).  Blood Alcohol level:  No results found for: [facility]  Metabolic Disorder Labs:  Lab Results  Component Value Date   HGBA1C 5.1 03/01/2017   No results found for: PROLACTIN No results found for: CHOL, TRIG, HDL, CHOLHDL, VLDL, LDLCALC  Current Medications: Current Facility-Administered Medications  Medication Dose Route Frequency Provider Last Rate Last Dose  . alum & mag hydroxide-simeth (MAALOX/MYLANTA) 200-200-20 MG/5ML suspension 30 mL  30 mL Oral Q4H PRN Money, Gerlene Burdockravis B, FNP      . [START ON 08/26/2017] buPROPion (WELLBUTRIN XL) 24 hr tablet 150 mg  150 mg Oral Daily Mariel CraftMaurer, Sheila M, MD      . hydrOXYzine (ATARAX/VISTARIL) tablet 25 mg  25 mg Oral TID PRN Money, Gerlene Burdockravis B, FNP      . ibuprofen (ADVIL,MOTRIN) tablet 600 mg  600 mg Oral Q6H PRN Money, Feliz Beamravis B, FNP      . magnesium hydroxide (MILK OF MAGNESIA) suspension 30 mL  30 mL Oral Daily PRN Money, Gerlene Burdockravis B, FNP      . Melatonin TABS 1 mg  1 mg Oral QHS Mariel CraftMaurer, Sheila M, MD      . Melene Muller[START ON 08/26/2017] methadone (DOLOPHINE) tablet 25 mg  25 mg Oral BID Mariel CraftMaurer, Sheila M, MD      . nicotine polacrilex (NICORETTE) gum 2 mg  2 mg Oral PRN Mariel CraftMaurer, Sheila M, MD      . OLANZapine zydis [facility](ZYPREXA) disintegrating tablet 10 mg  10 mg Oral BID PRN Mariel CraftMaurer, Sheila M, MD      . traZODone (DESYREL) tablet 50 mg  50 mg Oral QHS,MR X 1 Mariel CraftMaurer, Sheila M, MD       PTA Medications: Medications Prior to Admission  Medication Sig Dispense Refill Last Dose  . ibuprofen (ADVIL,MOTRIN) 600 MG tablet Take 1 tablet (600 mg total) by mouth every 6 (six) hours as needed. 30 tablet 0   . methadone (DOLOPHINE)  10 MG tablet Take 10 mg by mouth 5 (five) times daily.    08/13/2017 at 0900  . Prenatal Multivit-Min-Fe-FA (PRENATAL VITAMINS) 0.8 MG tablet Take 1 tablet by mouth daily. 30 tablet 5 08/12/2017 at 1200    Musculoskeletal: Strength & Muscle Tone: within normal limits Gait & Station: normal Patient leans: N/A  Psychiatric Specialty Exam: Physical Exam  Nursing note and vitals reviewed. Constitutional: She is  oriented to person, place, and time. She appears well-developed and well-nourished. No distress.  Musculoskeletal: Normal range of motion.  Neurological: She is alert and oriented to person, place, and time.  Psychiatric:  Depressed and anxious     Review of Systems  Gastrointestinal: Negative.   Musculoskeletal: Negative.   Neurological: Negative.   Psychiatric/Behavioral: Positive for depression and substance abuse (prescribed). Negative for hallucinations and suicidal ideas. The patient is nervous/anxious and has insomnia.     Blood pressure 126/82, pulse 71, temperature 98.3 F (36.8 C), temperature source Oral, resp. rate 16, height 5\' 7"  (1.702 m), weight 96.2 kg (212 lb), last menstrual period 11/13/2016, SpO2 100 %, unknown if currently breastfeeding.Body mass index is 33.2 kg/m.  General Appearance: Fairly Groomed  Eye Contact:  Good  Speech:  Normal Rate  Volume:  Normal  Mood:  Euthymic  Affect:  Labile and Tearful  Thought Process:  Descriptions of Associations: Circumstantial  Orientation:  Full (Time, Place, and Person)  Thought Content:  Logical and Tangential  Suicidal Thoughts:  No  Homicidal Thoughts:  No  Memory:  Immediate;   Good Recent;   Fair Remote;   Fair  Judgement:  Impaired  Insight:  Fair  Psychomotor Activity:  Normal  Concentration:  Concentration: Good  Recall:  Good  Fund of Knowledge:  Good  Language:  Good  Akathisia:  Negative  AIMS (if indicated):     Assets:  Financial Resources/Insurance Housing Intimacy Social Support   ADL's:  Intact  Cognition:  WNL  Sleep:       Treatment Plan Summary: Daily contact with patient to assess and evaluate symptoms and progress in treatment and Medication management    -Continue inpatient hospitalization.   Medication:   - Post-Partum Depression            - Wellbutrin 150mg  daily    -Insomnia            - trazodone 50mg  at bedtime, and repeat 1x PRN            -Anxiety                        -continue Atarax 50mg  po q6h prn anxiety   Chronic Back Pain   - methadone 25mg  oral 2x daily    -Encourage participation in groups and therapeutic milieu -Disposition planning will be ongoing   Observation Level/Precautions:  Continuous Observation  Laboratory:  CBC Chemistry Profile HbAIC UDS lipid profile if started on A1C  Psychotherapy:  attend groups on unit  Medications:  See above  Consultations:  n/a  Discharge Concerns:    Estimated LOS: <5 days  Other:     Physician Treatment Plan for Primary Diagnosis: MDD (major depressive disorder), severe (HCC) Long Term Goal(s): Improvement in symptoms so as ready for discharge  Short Term Goals: Ability to identify changes in lifestyle to reduce recurrence of condition will improve, Ability to verbalize feelings will improve, Ability to disclose and discuss suicidal ideas, Ability to identify and develop effective coping behaviors will improve and Compliance with prescribed medications will improve  Physician Treatment Plan for Secondary Diagnosis: Principal Problem:   MDD (major depressive disorder), severe (HCC)  Long Term Goal(s): Improvement in symptoms so as ready for discharge  Short Term Goals: Ability to verbalize feelings will improve, Ability to disclose and discuss suicidal ideas, Ability to identify and develop effective coping behaviors will improve and Compliance with prescribed medications will improve  I  certify that inpatient services furnished can reasonably be expected to improve the  patient's condition.     Patient seen by Medical student under my direct supervision.  Mariel Craft, MD 7/12/20195:08 PM

## 2017-08-26 DIAGNOSIS — F17210 Nicotine dependence, cigarettes, uncomplicated: Secondary | ICD-10-CM

## 2017-08-26 LAB — COMPREHENSIVE METABOLIC PANEL
ALBUMIN: 3.2 g/dL — AB (ref 3.5–5.0)
ALK PHOS: 46 U/L (ref 38–126)
ALT: 29 U/L (ref 0–44)
ANION GAP: 8 (ref 5–15)
AST: 19 U/L (ref 15–41)
BILIRUBIN TOTAL: 0.7 mg/dL (ref 0.3–1.2)
BUN: 8 mg/dL (ref 6–20)
CO2: 30 mmol/L (ref 22–32)
CREATININE: 0.7 mg/dL (ref 0.44–1.00)
Calcium: 9.2 mg/dL (ref 8.9–10.3)
Chloride: 107 mmol/L (ref 98–111)
GFR calc Af Amer: >60 mL/min (ref >60)
GFR calc non Af Amer: >60 mL/min (ref >60)
GLUCOSE: 96 mg/dL (ref 70–99)
Potassium: 4.2 mmol/L (ref 3.5–5.1)
SODIUM: 145 mmol/L (ref 135–145)
TOTAL PROTEIN: 5.9 g/dL — AB (ref 6.5–8.1)

## 2017-08-26 LAB — HEMOGLOBIN A1C
HEMOGLOBIN A1C: 5 % (ref 4.8–5.6)
MEAN PLASMA GLUCOSE: 96.8 mg/dL

## 2017-08-26 LAB — CBC
HEMATOCRIT: 30.8 % — AB (ref 36.0–46.0)
Hemoglobin: 10.1 g/dL — ABNORMAL LOW (ref 12.0–15.0)
MCH: 28.1 pg (ref 26.0–34.0)
MCHC: 32.8 g/dL (ref 30.0–36.0)
MCV: 85.8 fL (ref 78.0–100.0)
Platelets: 264 10*3/uL (ref 150–400)
RBC: 3.59 MIL/uL — AB (ref 3.87–5.11)
RDW: 13.4 % (ref 11.5–15.5)
WBC: 8.9 10*3/uL (ref 4.0–10.5)

## 2017-08-26 LAB — TSH: TSH: 0.691 u[IU]/mL (ref 0.350–4.500)

## 2017-08-26 LAB — LIPID PANEL
Cholesterol: 170 mg/dL (ref 0–200)
HDL: 75 mg/dL (ref >40)
LDL CALC: 83 mg/dL (ref 0–99)
Total CHOL/HDL Ratio: 2.3 RATIO
Triglycerides: 60 mg/dL (ref <150)
VLDL: 12 mg/dL (ref 0–40)

## 2017-08-26 MED ORDER — HYDROXYZINE HCL 50 MG PO TABS
50.0000 mg | ORAL_TABLET | Freq: Three times a day (TID) | ORAL | Status: DC | PRN
  Administered 2017-08-26 – 2017-08-28 (×5): 50 mg via ORAL
  Filled 2017-08-26 (×7): qty 1

## 2017-08-26 MED ORDER — HYDROXYZINE HCL 50 MG PO TABS
50.0000 mg | ORAL_TABLET | Freq: Once | ORAL | Status: AC
  Administered 2017-08-26: 50 mg via ORAL
  Filled 2017-08-26 (×2): qty 1

## 2017-08-26 NOTE — [facility] Group Notes (Signed)
  [facility]/BMU LCSW Group Therapy Note  Date/Time:  08/26/2017 11:15AM-12:00PM  Type of Therapy and Topic:  Group Therapy:  Feelings About Hospitalization  Participation Level:  Active   Description of Group This process group involved patients discussing their feelings related to being hospitalized, as well as the benefits they see to being in the hospital.  These feelings and benefits were itemized.  The group then brainstormed specific ways in which they could seek those same benefits when they discharge and return home.  Therapeutic Goals 1. Patient will identify and describe positive and negative feelings related to hospitalization 2. Patient will verbalize benefits of hospitalization to themselves personally 3. Patients will brainstorm together ways they can obtain similar benefits in the outpatient setting, identify barriers to wellness and possible solutions  Summary of Patient Progress:  The patient expressed her primary feelings about being hospitalized are "it's scary."  She was insightful in many of her comments.  Therapeutic Modalities Cognitive Behavioral Therapy Motivational Interviewing    Ambrose MantleMareida Grossman-Orr, LCSW 08/26/2017, 5:14 PM

## 2017-08-26 NOTE — Progress Notes (Signed)
D- Affect - appropriate.  Mood - depressed.  Behavior - appropriate with encouragement, direction and support. Laying in bed.  Advised RN that she is really tired and sleepy.   Goal is to get home to breastfeed her baby.        A- Medications per MD order.  Support given throughout the day,  1:1 spent time with patient.  R- Following treatment plan plan.  Denies SI/HI/AH and VH.  Contracts for safety.

## 2017-08-26 NOTE — Progress Notes (Signed)
D: Patient presents anxious, with flight of ideas. She is somatically preoccupied with multiple physical complaints, including constipation, lower back pain, and poor sleep. She received trazodone last night to help with sleep, but on self inventory denied receiving any. Her appetite is good, energy level hyperactive, and concentration poor. She rates her depression 5/10, feeling of hopelessness 0/10, and anxiety 10/10. She reports benzodiazepine cravings, and is requesting them for sleep.  A: Patient checked q15 min, and checks reviewed. Reviewed medication changes with patient and educated on side effects. Educated patient on importance of attending group therapy sessions and educated on several coping skills. Encouarged participation in milieu through recreation therapy and attending meals with peers. Provided support and encouragement. Fluids offered. R: Patient receptive to education on medications, and is medication compliant. Patient contracts for safety on the unit.

## 2017-08-26 NOTE — Plan of Care (Signed)
Patient pressed STARR activation button in her room this morning, and was educated on importance of coming to nurse's station for assistance. She is out in the milieu and interacting in the dayroom. RN provided medication for constipation and lower back pain.

## 2017-08-26 NOTE — Plan of Care (Signed)
D: Pt denies SI/HI/AV hallucinations. Pt is pleasant and cooperative. Pt goal for today is to work on her anxiety. A: Pt was offered support and encouragement. Pt was given scheduled medications. Pt was encourage to attend groups. Q 15 minute checks were done for safety.  R:Pt.  interacts well with peers and staff. Pt is taking medication. Pt has no complaints.Pt receptive to treatment and safety maintained on unit.    Problem: Safety: Goal: Periods of time without injury will increase Outcome: Progressing Note:  Patient denies SI and remains safe on unit.
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
## 2017-08-26 NOTE — [facility] Group Notes (Signed)
[facility] Group Notes:  (Nursing/MHT/Case Management/Adjunct)  Date:  08/26/2017  Time:  5:13 PM  Type of Therapy:  Nurse Education  Participation Level:  Minimal  Participation Quality:  Appropriate and Attentive  Affect:  Flat  Cognitive:  Alert and Appropriate  Insight:  Good  Engagement in Group:  Limited  Modes of Intervention:  Discussion and Exploration  Summary of Progress/Problems: In this group, the RNs discussed current problems, stressors, or life challenges. This included suicidal thoughts, alcohol and drug abuse, and emotional struggles. The patient had minimal participation in group session, but did relate to some of the struggles that her peers were having.   Kirstie MirzaJonathan C Seona Clemenson 08/26/2017, 5:13 PM

## 2017-08-27 MED ORDER — QUETIAPINE FUMARATE 25 MG PO TABS
25.0000 mg | ORAL_TABLET | Freq: Two times a day (BID) | ORAL | Status: DC
  Administered 2017-08-27 – 2017-08-28 (×3): 25 mg via ORAL
  Filled 2017-08-27 (×7): qty 1

## 2017-08-27 MED ORDER — QUETIAPINE FUMARATE ER 50 MG PO TB24: 50.0000 mg | ORAL_TABLET | Freq: Two times a day (BID) | ORAL | Status: DC

## 2017-08-27 NOTE — Plan of Care (Signed)
D: Patient presents somatically preoccupied and seeking xanax, but is improved since yesterday. Earlier this morning, it was reported that patient pressed the STARR button to ask "for permission to go in the dayroom." Her behavior is still bizarre at times. She complains of "irritation" on her feet due to her medication, however, her skin is warm, dry, smooth. She has some mild non-pitting edema to bilateral feet and ankles, and small excoriation to right foot. Patient is still pumping and requesting to save her milk (stored in fridge). Her sleep is still poor, and she feels the trazodone did not help her. Her appetite is "poor", energy normal and concentration "poor." She rated her depression 0/10, feeling of hopelessness and anxiety 10/10. She put in self inventory that she continues to withdraw from "benzodiazepines," and is having "tremors, cravings, agitation, and irritability." No tremors observed, and patient has been polite and pleasant this morning. She continues to complain of low back pain 10/10 and is requesting oxycodone, despite being continued on methadone. She denies SI/HI/AVH. A: Patient checked q15 min, and checks reviewed. Reviewed medication changes with patient and educated on side effects. Educated patient on importance of attending group therapy sessions and educated on several coping skills. Encouarged participation in milieu through recreation therapy and attending meals with peers. Support and encouragement provided. Fluids offered.  R: Patient receptive to education on medications, and is medication compliant. Patient contracts for safety on the unit. Her goal is "being present in the moment" and "help others who are in a worse state of mind than me" and "seeing a need, assistance to others." When asked what would keep her from discharge "My own physical limitations and not being able to take care of myself due to nerves, anxiety, depression, worry about my family" and "bad thoughts at  existing in this cruel world."
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
## 2017-08-27 NOTE — [facility] Group Notes (Signed)
[facility] LCSW Group Therapy Note  Date/Time:  08/27/2017  1:00-2:00PM  Type of Therapy and Topic:  Group Therapy:  Music and Mood  Participation Level:  Minimal   Description of Group: In this process group, members listened to a variety of genres of music and identified that different types of music evoke different responses.  Patients were encouraged to identify music that was soothing for them and music that was energizing for them.  Patients discussed how this knowledge can help with wellness and recovery in various ways including managing depression and anxiety as well as encouraging healthy sleep habits.    Therapeutic Goals: 1. Patients will explore the impact of different varieties of music on mood 2. Patients will verbalize the thoughts they have when listening to different types of music 3. Patients will identify music that is soothing to them as well as music that is energizing to them 4. Patients will discuss how to use this knowledge to assist in maintaining wellness and recovery 5. Patients will explore the use of music as a coping skill  Summary of Patient Progress:  At the beginning of group, patient expressed that she was feeling very sad today and rated it as a "9" with "10" being the worst sadness.  At the end of group, she rated her sadness as a "6" and said the music had helped.  Therapeutic Modalities: Solution Focused Brief Therapy Activity   Ambrose MantleMareida Grossman-Orr, LCSW

## 2017-08-28 MED ORDER — OLANZAPINE 10 MG PO TBDP: 10.0000 mg | ORAL_TABLET | Freq: Three times a day (TID) | ORAL | Status: DC | PRN

## 2017-08-28 MED ORDER — PAROXETINE HCL 20 MG PO TABS
20.0000 mg | ORAL_TABLET | Freq: Every day | ORAL | Status: DC
  Administered 2017-08-28 – 2017-08-30 (×3): 20 mg via ORAL
  Filled 2017-08-28 (×4): qty 1

## 2017-08-28 MED ORDER — LORAZEPAM 1 MG PO TABS
1.0000 mg | ORAL_TABLET | ORAL | Status: AC | PRN
  Administered 2017-08-29: 1 mg via ORAL
  Filled 2017-08-28: qty 1

## 2017-08-28 MED ORDER — ZIPRASIDONE MESYLATE 20 MG IM SOLR: 20.0000 mg | INTRAMUSCULAR | Status: DC | PRN

## 2017-08-28 NOTE — Tx Team (Signed)
Interdisciplinary Treatment and Diagnostic Plan Update  08/28/2017 Time of Session: 8:53 AM  Vicki [name] MRN: [id number]  Principal Diagnosis: MDD (major depressive disorder), severe ([place])  Secondary Diagnoses: Principal Problem:   MDD (major depressive disorder), severe ([place])   Current Medications:  Current Facility-Administered Medications  Medication Dose Route Frequency Provider Last Rate Last Dose  . alum & mag hydroxide-simeth (MAALOX/MYLANTA) 200-200-20 MG/5ML suspension 30 mL  30 mL Oral Q4H PRN Money, Darnelle Maffucci B, FNP      . buPROPion (WELLBUTRIN XL) 24 hr tablet 150 mg  150 mg Oral Daily Lavella Hammock, MD   150 mg at 08/28/17 3295  . hydrOXYzine (ATARAX/VISTARIL) tablet 50 mg  50 mg Oral TID PRN Derrill Center, NP   50 mg at 08/27/17 2136  . ibuprofen (ADVIL,MOTRIN) tablet 600 mg  600 mg Oral Q6H PRN Money, Lowry Ram, FNP   600 mg at 08/28/17 0831  . magnesium hydroxide (MILK OF MAGNESIA) suspension 30 mL  30 mL Oral Daily PRN Money, Darnelle Maffucci B, FNP   30 mL at 08/27/17 0858  . Melatonin TABS 1 mg  1 mg Oral QHS Lavella Hammock, MD   1 mg at 08/27/17 2135  . methadone (DOLOPHINE) tablet 25 mg  25 mg Oral BID Lavella Hammock, MD   25 mg at 08/28/17 1884  . nicotine polacrilex (NICORETTE) gum 2 mg  2 mg Oral PRN Lavella Hammock, MD   2 mg at 08/27/17 2018  . OLANZapine zydis (ZYPREXA) disintegrating tablet 10 mg  10 mg Oral BID PRN Lavella Hammock, MD      . QUEtiapine (SEROQUEL) tablet 25 mg  25 mg Oral BID Derrill Center, NP   25 mg at 08/28/17 0829  . traZODone (DESYREL) tablet 50 mg  50 mg Oral QHS,MR X 1 Lavella Hammock, MD   50 mg at 08/27/17 2230    PTA Medications: Medications Prior to Admission  Medication Sig Dispense Refill Last Dose  . ibuprofen (ADVIL,MOTRIN) 600 MG tablet Take 1 tablet (600 mg total) by mouth every 6 (six) hours as needed. 30 tablet 0   . methadone (DOLOPHINE) 10 MG tablet Take 10 mg by mouth 5 (five) times daily.    08/13/2017 at 0900  .  Prenatal Multivit-Min-Fe-FA (PRENATAL VITAMINS) 0.8 MG tablet Take 1 tablet by mouth daily. 30 tablet 5 08/12/2017 at 1200    Patient Stressors: Financial difficulties Health problems Occupational concerns Other: new baby  Patient Strengths: Ability for insight Active sense of humor Capable of independent living Communication skills Motivation for treatment/growth Religious Affiliation Supportive family/friends  Treatment Modalities: Medication Management, Group therapy, Case management,  1 to 1 session with clinician, Psychoeducation, Recreational therapy.   Physician Treatment Plan for Primary Diagnosis: MDD (major depressive disorder), severe ([place]) Long Term Goal(s): Improvement in symptoms so as ready for discharge  Short Term Goals: Ability to identify changes in lifestyle to reduce recurrence of condition will improve Ability to verbalize feelings will improve Ability to disclose and discuss suicidal ideas Ability to identify and develop effective coping behaviors will improve Compliance with prescribed medications will improve Ability to verbalize feelings will improve Ability to disclose and discuss suicidal ideas Ability to identify and develop effective coping behaviors will improve Compliance with prescribed medications will improve  Medication Management: Evaluate patient's response, side effects, and tolerance of medication regimen.  Therapeutic Interventions: 1 to 1 sessions, Unit Group sessions and Medication administration.  Evaluation of Outcomes: Progressing  Physician  Treatment Plan for Secondary Diagnosis: Principal Problem:   MDD (major depressive disorder), severe ([place])   Long Term Goal(s): Improvement in symptoms so as ready for discharge  Short Term Goals: Ability to identify changes in lifestyle to reduce recurrence of condition will improve Ability to verbalize feelings will improve Ability to disclose and discuss suicidal ideas Ability to  identify and develop effective coping behaviors will improve Compliance with prescribed medications will improve Ability to verbalize feelings will improve Ability to disclose and discuss suicidal ideas Ability to identify and develop effective coping behaviors will improve Compliance with prescribed medications will improve  Medication Management: Evaluate patient's response, side effects, and tolerance of medication regimen.  Therapeutic Interventions: 1 to 1 sessions, Unit Group sessions and Medication administration.  Evaluation of Outcomes: Progressing   RN Treatment Plan for Primary Diagnosis: MDD (major depressive disorder), severe ([place]) Long Term Goal(s): Knowledge of disease and therapeutic regimen to maintain health will improve  Short Term Goals: Ability to identify and develop effective coping behaviors will improve and Compliance with prescribed medications will improve  Medication Management: RN will administer medications as ordered by provider, will assess and evaluate patient's response and provide education to patient for prescribed medication. RN will report any adverse and/or side effects to prescribing provider.  Therapeutic Interventions: 1 on 1 counseling sessions, Psychoeducation, Medication administration, Evaluate responses to treatment, Monitor vital signs and CBGs as ordered, Perform/monitor CIWA, COWS, AIMS and Fall Risk screenings as ordered, Perform wound care treatments as ordered.  Evaluation of Outcomes: Progressing   LCSW Treatment Plan for Primary Diagnosis: MDD (major depressive disorder), severe ([place]) Long Term Goal(s): Safe transition to appropriate next level of care at discharge, Engage patient in therapeutic group addressing interpersonal concerns.  Short Term Goals: Engage patient in aftercare planning with referrals and resources  Therapeutic Interventions: Assess for all discharge needs, 1 to 1 time with Social worker, Explore available  resources and support systems, Assess for adequacy in community support network, Educate family and significant other(s) on suicide prevention, Complete Psychosocial Assessment, Interpersonal group therapy.  Evaluation of Outcomes: Met   Progress in Treatment: Attending groups: Intermittently Participating in groups: intermittently Taking medication as prescribed: Yes Toleration medication: Yes, no side effects reported at this time Family/Significant other contact made: No Patient understands diagnosis: Yes AEB asking for help with mental health stabilization and sleep Discussing patient identified problems/goals with staff: Yes Medical problems stabilized or resolved: Yes Denies suicidal/homicidal ideation: Yes Issues/concerns per patient self-inventory: None Other: N/A  New problem(s) identified: None identified at this time.   New Short Term/Long Term Goal(s): "I want to be a better for person for everyone in my family.  I'll do that by getting back to sleeping well, eating well and exercising."   Discharge Plan or Barriers:   Reason for Continuation of Hospitalization: Anxiety Disorganization Depression Medication stabilization   Estimated Length of Stay: 7/19  Attendees: Patient: Vicki [name] 08/28/2017  8:53 AM  Physician: Melba Coon, MD 08/28/2017  8:53 AM  Nursing: Sena Hitch, RN 08/28/2017  8:53 AM  RN Care Manager: Lars Pinks, RN 08/28/2017  8:53 AM  Social Worker: Ripley Fraise 08/28/2017  8:53 AM  Recreational Therapist: Winfield Cunas 08/28/2017  8:53 AM  Other: Norberto Sorenson 08/28/2017  8:53 AM  Other:  08/28/2017  8:53 AM    Scribe for Treatment Team:  Roque Lias LCSW 08/28/2017 8:53 AM

## 2017-08-28 NOTE — Progress Notes (Signed)
Recreation Therapy Notes  Date: 7.15.19 Time: 1000 Location: 500 Dayroom  Group Topic: Coping Skills  Goal Area(s) Addresses:  Pt will able to identify positive coping skills. Pt will able to identify benefits of using coping skills post d/c.  Behavioral Response: Engaged  Intervention: Worksheet    Activity: Technical brewerCoping Skills Mind map.  LRT and patients filled in the first 8 boxes (anger, depression, stress, attitude/low self-esteem, manic, communication, alcohol and isolation) together.  Patients were to then identify 3 coping skills for each situation before coming back together as [name] group.  Education: PharmacologistCoping Skills, Building control surveyorDischarge Planning.   Education Outcome: Acknowledges understanding/In group clarification offered/Needs additional education.   Clinical Observations/Feedback: Pt was quiet but engaged when prompted.  Pt identified one coping skill as listening to music.  Pt was pleasant and attentive in group.   Vicki [name], Vicki [name]         Vicki [name], Vicki [name] 08/28/2017 1:00 PM

## 2017-08-28 NOTE — Progress Notes (Signed)
[facility] MD Progress Note  08/28/2017 5:49 PM Vicki [name]  MRN:  [id number]   History: Per [facility] Assessment Note-Vicki T Jonesis a 37 y.o.femalewho was brought to [facility] by Vicki [name] fiance due to ongoing mental health difficulties she's been having since she was discharged from the hospital after the birth of Vicki [name] daughter on 08/13/17. Pt's fiance states they took pt to the hospital on 08/23/17 and she was transferred to [facility] and kept overnight for observation, but that nothing changed. Pt's fiance states things have continued to get worse, so they decided pt should come to [facility] today for an assessment to determine what was wrong. Pt's fiance shares pt has also been irritable, angry, and hostile and expressed wanting to kill him; pt expresses no HI, though she does identify that she's gotten mad at times. Pt's fiance states pt has been very possessive, including over the baby, and that pt will refuse to let him have the baby if the baby needs to be changed. He states pt also unbuckled the baby's car seat while he was driving down the highway, which was very scary. He shares pt was trying to jump out of the car on the way to the hospital. Patient confirms she wanted to get out of the car because Fiance kept talking so much. She has also been confused at times, stating doctors at the hospital the day prior looked to Vicki [name] like cartoon characters, and she has had difficulty remembering people's names    Subjective:   Vicki [name] is seen, chart reviewed. The chart findings discussed with the treatment team.  Vicki [name] seen sitting in dayroom interacting with peers.  Patient states that she had not been getting sleep since Vicki [name] baby was born.  She endorses that she "took too much Methadone trying to fall asleep, and ended up being transferred to [facility] in the MICU.  She states that she needs outpatient mental health treatment, and states that Vicki [name] 71 year old daughter lives in the custody of Vicki [name] parents  in [place], [place].  She would like to receive services there, where she can get support. She requests medication for anxiety, and states she has been prescribed benzodiazepines in the past.  She is aware that BZD's and Methadone in combination put Vicki [name] at increased risk for accidental overdose.  Patient appears to have poor insight regarding medication and current diagnosis. She states Vicki [name] father took Paxil for his anxiety.  She notes that Wellbutrin has increased Vicki [name] level of anxiety.  Patient tolerated addition of Seroquel and Trazodone, but continues to report difficulty falling asleep. She has been pumping, education was provided with breast-feeding and current medications. Denies auditory or visual hallucinations during this assessment.  Denies suicidal or homicidal ideation. Patient continues to present slightly pressured and mood irritability/lability. Support encouragement reassurance was provided  Principal Problem: MDD (major depressive disorder), severe (HCC) Diagnosis:   Patient Active Problem List   Diagnosis Date Noted  . MDD (major depressive disorder), severe (HCC) [F32.2] 08/25/2017  . Post-operative state [Z98.890] 08/13/2017  . 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 Proteinuria in pregnancy, antepartum [O12.10] 03/02/2017  . Obesity (BMI 30-39.9) [E66.9] 03/01/2017  . Obesity in pregnancy [O99.210] 03/01/2017  . 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 Supervision of high risk pregnancy, antepartum, second trimester [O09.92] 01/27/2017  . Previous cesarean delivery, antepartum [O34.219] 01/27/2017  . H/O degenerative disc disease [Z87.39] 01/27/2017  . AMA (advanced maternal age) multigravida 35+ [O09.529] 01/27/2017  Total Time spent with patient: 45 minutes  Past Psychiatric History: Reports previous inpatient admission   Past Medical History:  Past Medical History:  Diagnosis Date  . Anxiety   . Back pain   . Degenerative disc  disease, lumbar   . History of ectopic pregnancy   . Methadone maintenance therapy patient (HCC)   . Opiate dependence (HCC)   . Substance abuse [facility])     Past Surgical History:  Procedure Laterality Date  . CESAREAN SECTION    . CESAREAN SECTION N/A 08/13/2017   Procedure: REPEAT CESAREAN SECTION;  Surgeon: Hermina Staggers, MD;  Location: [facility] BIRTHING SUITES;  Service: Obstetrics;  Laterality: N/A;  . CHOLECYSTECTOMY     Family History:  Family History  Problem Relation Age of Onset  . Hypertension Mother   . Thyroid disease Father   . Hypertension Father   . Diabetes Father    Family Psychiatric  History:  Social History:  Social History   Substance and Sexual Activity  Alcohol Use No  . Frequency: Never     Social History   Substance and Sexual Activity  Drug Use Yes  . Types: Oxycodone   Comment: methadone    Social History   Socioeconomic History  . Marital status: Single    Spouse name: Not on file  . Number of children: Not on file  . Years of education: Not on file  . Highest education level: Not on file  Occupational History  . Not on file  Social Needs  . Financial resource strain: Not on file  . Food insecurity:    Worry: Not on file    Inability: Not on file  . Transportation needs:    Medical: Not on file    Non-medical: Not on file  Tobacco Use  . Smoking status: Current Every Day Smoker    Types: Cigarettes, E-cigarettes  . Smokeless tobacco: Never Used  . Tobacco comment: 3 cigarettes per day  Substance and Sexual Activity  . Alcohol use: No    Frequency: Never  . Drug use: Yes    Types: Oxycodone    Comment: methadone  . Sexual activity: Yes    Birth control/protection: None  Lifestyle  . Physical activity:    Days per week: Not on file    Minutes per session: Not on file  . Stress: Not on file  Relationships  . Social connections:    Talks on phone: Not on file    Gets together: Not on file    Attends religious service: Not  on file    Active member of club or organization: Not on file    Attends meetings of clubs or organizations: Not on file    Relationship status: Not on file  Other Topics Concern  . Not on file  Social History Narrative  . Not on file   Additional Social History:    Pain Medications: Please see MAR Prescriptions: Please see MAR Over the Counter: Please see MAR History of alcohol / drug use?: No history of alcohol / drug abuse Longest period of sobriety (when/how long): 6 years          See H&P          Sleep: Fair  Appetite:  Fair  Current Medications: Current Facility-Administered Medications  Medication Dose Route Frequency Provider Last Rate Last Dose  . alum & mag hydroxide-simeth (MAALOX/MYLANTA) 200-200-20 MG/5ML suspension 30 mL  30 mL Oral Q4H PRN Money, Gerlene Burdock, FNP      .  hydrOXYzine (ATARAX/VISTARIL) tablet 50 mg  50 mg Oral TID PRN Oneta RackLewis, Tanika N, NP   50 mg at 08/27/17 2136  . ibuprofen (ADVIL,MOTRIN) tablet 600 mg  600 mg Oral Q6H PRN Money, Gerlene Burdockravis B, FNP   600 mg at 08/28/17 0831  . magnesium hydroxide (MILK OF MAGNESIA) suspension 30 mL  30 mL Oral Daily PRN Money, Feliz Beamravis B, FNP   30 mL at 08/27/17 0858  . Melatonin TABS 1 mg  1 mg Oral QHS Mariel CraftMaurer, Antoniette Peake M, MD   1 mg at 08/27/17 2135  . methadone (DOLOPHINE) tablet 25 mg  25 mg Oral BID Mariel CraftMaurer, Constance Hackenberg M, MD   25 mg at 08/28/17 1332  . nicotine polacrilex (NICORETTE) gum 2 mg  2 mg Oral PRN Mariel CraftMaurer, Jericca Russett M, MD   2 mg at 08/27/17 2018  . OLANZapine zydis (ZYPREXA) disintegrating tablet 10 mg  10 mg Oral BID PRN Mariel CraftMaurer, Emilee Market M, MD      . PARoxetine (PAXIL) tablet 20 mg  20 mg Oral Daily Mariel CraftMaurer, Waldemar Siegel M, MD   20 mg at 08/28/17 1208  . QUEtiapine (SEROQUEL) tablet 25 mg  25 mg Oral BID Oneta RackLewis, Tanika N, NP   25 mg at 08/28/17 1624  . traZODone (DESYREL) tablet 50 mg  50 mg Oral QHS,MR X 1 Mariel CraftMaurer, Nylani Michetti M, MD   50 mg at 08/27/17 2230    Lab Results:  No results found for this or any previous visit (from  the past 48 hour(s)).  Blood Alcohol level:  No results found for: [facility]  Metabolic Disorder Labs: Lab Results  Component Value Date   HGBA1C 5.0 08/26/2017   MPG 96.8 08/26/2017   No results found for: PROLACTIN Lab Results  Component Value Date   CHOL 170 08/26/2017   TRIG 60 08/26/2017   HDL 75 08/26/2017   CHOLHDL 2.3 08/26/2017   VLDL 12 08/26/2017   LDLCALC 83 08/26/2017    Physical Findings: AIMS: Facial and Oral Movements Muscles of Facial Expression: None, normal Lips and Perioral Area: None, normal Jaw: None, normal Tongue: None, normal,Extremity Movements Upper (arms, wrists, hands, fingers): None, normal Lower (legs, knees, ankles, toes): None, normal, Trunk Movements Neck, shoulders, hips: None, normal, Overall Severity Severity of abnormal movements (highest score from questions above): None, normal Incapacitation due to abnormal movements: None, normal Patient's awareness of abnormal movements (rate only patient's report): No Awareness, Dental Status Current problems with teeth and/or dentures?: No Does patient usually wear dentures?: No  CIWA:  CIWA-Ar Total: 3 COWS:  COWS Total Score: 1  Musculoskeletal: Strength & Muscle Tone: within normal limits Gait & Station: normal Patient leans: N/A  Psychiatric Specialty Exam: Physical Exam  Nursing note and vitals reviewed. Constitutional: She appears well-developed.  HENT:  Head: Normocephalic.  Cardiovascular: Normal rate.  Psychiatric: Vicki [name] behavior is normal.  Anxious     Review of Systems  Psychiatric/Behavioral: Positive for depression. The patient is nervous/anxious and has insomnia.   All other systems reviewed and are negative.   Blood pressure 134/82, pulse 94, temperature 98.7 F (37.1 C), temperature source Oral, resp. rate 20, height 5\' 7"  (1.702 m), weight 96.2 kg (212 lb), last menstrual period 11/13/2016, SpO2 100 %, unknown if currently breastfeeding.Body mass index is 33.2 kg/m.   General Appearance: Casual and Guarded irritable   Eye Contact:  Good  Speech:  Clear and Coherent  Volume:  Normal  Mood:  Anxious  Affect:  Congruent  Thought Process:  Coherent  Orientation:  Full (  Time, Place, and Person)  Thought Content:  Hallucinations: None  Suicidal Thoughts:  No  Homicidal Thoughts:  No  Memory:  Immediate;   Fair Recent;   Fair Remote;   Fair  Judgement:  Fair  Insight:  Fair  Psychomotor Activity:  Normal  Concentration:  Concentration: Fair  Recall:  [employer] of Knowledge:  Fair  Language:  Fair  Akathisia:  No  Handed:  Right  AIMS (if indicated):     Assets:  Communication Skills Desire for Improvement Social Support  ADL's:  Intact  Cognition:  WNL  Sleep:  Number of Hours: 6.75     Treatment Plan Summary: Daily contact with patient to assess and evaluate symptoms and progress in treatment and Medication management   Continue with current treatment plan on 08/27/2017 as listed below except where noted   Medication:  - Post-Partum Depression - Discontinue Wellbutrin 150mg  daily due to increased anxiety  - Start Paxil 20 mg PO QD for anxiety/depression  - Discontinue Seroquel  -Insomnia             - trazodone 50mg  at bedtime, and repeat 1x PRN  - start Melatonin 3 mg at 6 PM to induce regular sleep onset.  -Anxiety Continue  Atarax 50mg  po q6h prn anxiety   Chronic Back Pain              - methadone 25mg  oral 2x daily   -Encourage participation in groups and therapeutic milieu -Disposition planning will be ongoing   Mariel Craft, MD 08/28/2017, 5:49 PM

## 2017-08-28 NOTE — Progress Notes (Cosign Needed)
D: Patient is restless and complaining of chronic back pain.  She states is better with the methodone dosage, however, she takes a higher dose at home.  Patient is anxious and asking to see the MD again.  She states, "I'm trying to get hold of my fiance and I can't reach him.  I need to find out about my child care."  Patient states, "I've had trouble with this hospital; it's in my chart.  I don't think any of this is helping my issues."  She denies any thoughts of self harm.  She rates her depression, hopelessness and anxiety as a 10.  Her goal today is to "have less pain, more energy and better mood."  Her affect is flat; her mood is depressed.  A: Continue to monitor medication management and MD orders.  Safety checks completed every 15 minutes per protocol.  Offer support and encouragement as needed.  R: Patient is irritable and restless.

## 2017-08-28 NOTE — Plan of Care (Signed)
D: Pt denies SI/HI/AV hallucinations. Pt is pleasant and cooperative. Pt comes to desk often with multiple concerns about anxiety, sleep, medications. Concerns address with patient but she has difficulty recalling information at times.  A: Pt was offered support and encouragement. Pt was given scheduled medications. Pt was encourage to attend groups. Q 15 minute checks were done for safety.  R:Pt attends groups and interacts well with peers and staff. Pt is taking medication. Pt has no complaints.Pt receptive to treatment and safety maintained on unit.    Problem: Safety: Goal: Periods of time without injury will increase Outcome: Progressing Note:  Patient denies SI and remains safe on unit

## 2017-08-28 NOTE — Progress Notes (Signed)
Recreation Therapy Notes  INPATIENT RECREATION THERAPY ASSESSMENT  Patient Details Name: Vicki [name] MRN: [id number] DOB: 1980-06-14 Today's Date: 08/28/2017       Information Obtained From: Patient  Able to Participate in Assessment/Interview: Yes  Patient Presentation: Alert, Oriented  Reason for Admission (Per Patient): Other (Comments)(Pt stated she was here for observation.)  Patient Stressors: Other (Comment)(Lack of sleep; Lowerback pain)  Coping Skills:   Isolation, Self-Injury, Write, Sports, TV, Music, Exercise, Meditate, Deep Breathing, Substance Abuse, Impulsivity, Talk, Prayer, Avoidance, Read, Dance, Hot Bath/Shower  Leisure Interests (2+):  Individual - Reading, Music - Listen, Social - Family, Social - Friends, Sports - Exercise (Comment)(Yoga)  Frequency of Recreation/Participation: Other (Comment)(Daily)  Awareness of Community Resources:  Yes  Community Resources:  Park, Public affairs consultantestaurants  Current Use: Yes  If no, Barriers?:    Expressed Interest in State Street CorporationCommunity Resource Information: No  Enbridge EnergyCounty of Residence:  [place]  Patient Main Form of Transportation: Set designerCar  Patient Strengths:  Wellsite geologistGood listener; Responsible  Patient Identified Areas of Improvement:  ComptrollerBetter parent/daughter; Be more present in the moment  Patient Goal for Hospitalization:  "Cope with depression, anxiety and pain without medication"  Current SI (including self-harm):  No  Current HI:  No  Current AVH: No  Staff Intervention Plan: Group Attendance, Collaborate with Interdisciplinary Treatment Team  Consent to Intern Participation: N/A    Caroll RancherMarjette Blaine Guiffre, LRT/CTRS  Caroll RancherLindsay, Goldye Tourangeau A 08/28/2017, 1:38 PM

## 2017-08-28 NOTE — Plan of Care (Signed)
D:Patient observed in room visiting with family. Patient somewhat anxious at beginning of shift. Patient denies SI/HI. Patient has been reclusive to her room tonight. Patient does interact with staff and peers. A:Patient encouraged to attend group. Patient administered medications as prescribed. Q 15 minute checks in progress and patient remains safe. Patient provided support and encouragement.  R:Patient remains safe on unit. Patient is taking medications as prescribed.    Problem: Safety: Goal: Periods of time without injury will increase Outcome: Progressing Note:  Patient denies SI and remains safe on unit.

## 2017-08-29 MED ORDER — OLANZAPINE 10 MG PO TABS
10.0000 mg | ORAL_TABLET | Freq: Every day | ORAL | Status: DC
  Administered 2017-08-29: 10 mg via ORAL
  Filled 2017-08-29 (×4): qty 1

## 2017-08-29 MED ORDER — MELATONIN 1 MG PO TABS
1.0000 mg | ORAL_TABLET | Freq: Every day | ORAL | Status: DC
  Administered 2017-08-29: 1 mg via ORAL
  Filled 2017-08-29 (×2): qty 1
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
## 2017-08-29 NOTE — Plan of Care (Signed)
Problem: Safety: Goal: Periods of time without injury will increase Intervention: Patient contracts for safety on the unit. Low fall risk precautions in place. Safety monitored with q15 minute checks. Outcome: Patient remains safe on the unit at this time. 08/29/2017 10:34 AM - Progressing by Ferrel Loganollazo, Lindyn Vossler A, RN

## 2017-08-29 NOTE — Progress Notes (Signed)
Patient ID: Vicki [name], female   DOB: 06-Feb-1981, 37 y.o.   MRN: [id number]  Nursing Progress Note [id number]  Data: Patient presents with hyperactivity and restlessness. Patient complaint with scheduled medications. Patient has several physical complaints this morning including chronic back pain, withdrawal symptoms, constipation, and anxiety. Patient is seen attending groups and visible in the milieu. Patient currently denies SI/HI/AVH. Patient completed self-inventory sheet in great detail. Patient rates depression, hopelessness, and anxiety 8,0,8 respectively. Patient rates their sleep and appetite as good/good respectively. Patient states goal for today is to "get discharged with 30 day supply of medications and speak to the doctor".  Patient reports on her self-inventory sheet, "I am not getting my basic needs met, and I don't know if my children/fiance are okay". Patient writes, "I need benzodiazepines, better access to checking on my family, (IE having my cell phone) and and MRI for more detailed info on my back".  Action: Patient educated about and provided medication per provider's orders. Patient safety maintained with q15 min safety checks and frequent rounding. Low fall risk precautions in place. Emotional support given. 1:1 interaction and active listening provided. Patient encouraged to attend meals and groups. Patient encouraged to work on treatment plan and goals. Labs, vital signs and patient behavior monitored throughout shift.   Response: Patient agrees to come to staff if any thoughts of SI/HI develop or if patient develops intention of acting on thoughts. Patient remains safe on the unit at this time. Patient is interacting with peers appropriately on the unit. Will continue to support and monitor.

## 2017-08-29 NOTE — Progress Notes (Signed)
Adult Psychoeducational Group Note  Date:  08/29/2017 Time:  8:40 PM  Group Topic/Focus:  Wrap-Up Group:   The focus of this group is to help patients review their daily goal of treatment and discuss progress on daily workbooks.  Participation Level:  Active  Participation Quality:  Appropriate  Affect:  Appropriate  Cognitive:  Appropriate  Insight: Appropriate  Engagement in Group:  Engaged  Modes of Intervention:  Discussion  Additional Comments:  The patient expressed that she attended all groups and is preparing for discharge.The patient also said that she rates today a 7.  Octavio Mannshigpen, Ahjanae Cassel Lee 08/29/2017, 8:40 PM

## 2017-08-29 NOTE — [facility] Suicide Risk Assessment (Signed)
[facility] INPATIENT:  Family/Significant Other Suicide Prevention Education  Suicide Prevention Education:  Education Completed; Vicki [name] [phone]  has been identified by the patient as the family member/significant other with whom the patient will be residing, and identified as the person(s) who will aid the patient in the event of a mental health crisis (suicidal ideations/suicide attempt).  With written consent from the patient, the family member/significant other has been provided the following suicide prevention education, prior to the and/or following the discharge of the patient.  The suicide prevention education provided includes the following:  Suicide risk factors  Suicide prevention and interventions  National Suicide Hotline telephone number  [facility] assessment telephone number  [facility] Emergency Assistance 911  [facility] and/or Residential Mobile Crisis Unit telephone number  Request made of family/significant other to:  Remove weapons (e.g., guns, rifles, knives), all items previously/currently identified as safety concern.    Remove drugs/medications (over-the-counter, prescriptions, illicit drugs), all items previously/currently identified as a safety concern.  The family member/significant other verbalizes understanding of the suicide prevention education information provided.  The family member/significant other agrees to remove the items of safety concern listed above. Vicki [name] confirms that they will be glad to have patient stay with them at d/c, and will also hold and dispense her medication.  Confirmed plan for [place]. He is concerned about d/c date because as recently as last night she made the statement to family that she wishes she could go to sleep and not get up.  Vicki [name] confirmed that he will double check that guns in home are locked away so she does not have access.  Went over treatment team  recommendations and crises plan.  [name] 08/29/2017, 10:29 AM

## 2017-08-29 NOTE — Progress Notes (Signed)
Recreation Therapy Notes  Date: 7.16.19 Time: 1000 Location: 500 Hall Dayroom  Group Topic: Anger Management  Goal Area(s) Addresses:  Patient will identify triggers for anger.  Patient will identify physical reaction to anger.   Patient will identify benefit of using coping skills when angry.  Behavioral Response: Engaged  Intervention: Worksheet  Activity: Introduction to Anger Management.  Patients were to identify 3 situations, topics or people that lead to feelings of anger; what they do when angry and the problems they have run into because of anger.  Education: Anger Management, Discharge Planning   Education Outcome: Acknowledges education/In group clarification offered/Needs additional education.   Clinical Observations/Feedback: Pt stated authority, large amounts of paperwork such as financial and answering to people in life.  Pt stated when she gets angry her body has [name] chemical reaction, she yells, stomps, shouts and over thinks.  Pt expressed anger has cost her medically and dealing with authority.    Vicki RancherMarjette [name] [name], Vicki [name]        Vicki [name], Vicki [name] 08/29/2017 11:25 AM

## 2017-08-30 MED ORDER — HYDROXYZINE HCL 50 MG PO TABS: 50.0000 mg | ORAL_TABLET | Freq: Three times a day (TID) | ORAL | 0 refills | Status: DC | PRN

## 2017-08-30 MED ORDER — MELATONIN 1 MG PO TABS: 1.0000 mg | ORAL_TABLET | Freq: Every day | ORAL | Status: DC

## 2017-08-30 MED ORDER — NICOTINE POLACRILEX 2 MG MT GUM: 2.0000 mg | CHEWING_GUM | OROMUCOSAL | 0 refills | Status: DC | PRN

## 2017-08-30 MED ORDER — OLANZAPINE 10 MG PO TABS: 10.0000 mg | ORAL_TABLET | Freq: Every day | ORAL | 0 refills | Status: DC

## 2017-08-30 MED ORDER — PAROXETINE HCL 20 MG PO TABS: 20.0000 mg | ORAL_TABLET | Freq: Every day | ORAL | 0 refills | Status: DC

## 2017-08-30 MED ORDER — METHADONE HCL 5 MG PO TABS: 25.0000 mg | ORAL_TABLET | Freq: Two times a day (BID) | ORAL | 0 refills | Status: AC

## 2017-08-30 MED ORDER — METHADONE HCL 5 MG PO TABS
25.0000 mg | ORAL_TABLET | Freq: Once | ORAL | Status: AC
  Administered 2017-08-30: 25 mg via ORAL
  Filled 2017-08-30: qty 1

## 2017-08-30 NOTE — Plan of Care (Signed)
Pt was able to identify coping skills at completion of recreation therapy sessions.   Caroll RancherMarjette Darleene Cumpian, LRT/CTRS

## 2017-08-30 NOTE — Progress Notes (Signed)
Recreation Therapy Notes  INPATIENT RECREATION TR PLAN  Patient Details Name: Vicki [name] MRN: [id number] DOB: 1980/06/05 Today's Date: 08/30/2017  Rec Therapy Plan Is patient appropriate for Therapeutic Recreation?: Yes Treatment times per week: about 3 days  Estimated Length of Stay: 5-7 days TR Treatment/Interventions: Group participation (Comment)  Discharge Criteria Pt will be discharged from therapy if:: Discharged Treatment plan/goals/alternatives discussed and agreed upon by:: Patient/family  Discharge Summary Short term goals set: See patient care plan Short term goals met: Complete Progress toward goals comments: Groups attended Which groups?: Self-esteem, Coping skills, Anger management Reason goals not met: None Therapeutic equipment acquired: N/A Reason patient discharged from therapy: Discharge from hospital Pt/family agrees with progress & goals achieved: Yes Date patient discharged from therapy: 08/30/17    Victorino Sparrow, LRT/CTRS  Ria Comment, [name] A 08/30/2017, 11:38 AM

## 2017-08-30 NOTE — Plan of Care (Signed)
  Problem: Education: Goal: Knowledge of [facility] Education information/materials will improve Outcome: Completed/Met Goal: Emotional status will improve Outcome: Completed/Met Goal: Mental status will improve Outcome: Completed/Met Goal: Verbalization of understanding the information provided will improve Outcome: Completed/Met   Problem: Activity: Goal: Interest or engagement in activities will improve Outcome: Completed/Met Goal: Sleeping patterns will improve Outcome: Completed/Met   Problem: Coping: Goal: Ability to verbalize frustrations and anger appropriately will improve Outcome: Completed/Met Goal: Ability to demonstrate self-control will improve Outcome: Completed/Met   Problem: Health Behavior/Discharge Planning: Goal: Identification of resources available to assist in meeting health care needs will improve Outcome: Completed/Met Goal: Compliance with treatment plan for underlying cause of condition will improve Outcome: Completed/Met   Problem: Physical Regulation: Goal: Ability to maintain clinical measurements within normal limits will improve Outcome: Completed/Met   Problem: Safety: Goal: Periods of time without injury will increase Outcome: Completed/Met   Problem: Spiritual Needs Goal: Ability to function at adequate level Outcome: Completed/Met
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
## 2017-08-30 NOTE — Progress Notes (Signed)
Pt is on unit and engaged in the milieu.  Pt makes good eye contact, is friendly and cooperative.  Pt denies pain of discomfort.  Pt currently denies SI, HI and AVH and contracts verbally for safety.  Pt is med compliant and expresses no needs at this time.  Pt in room sleeping Pt remains safe on unit at this time.

## 2017-08-30 NOTE — Progress Notes (Signed)
Recreation Therapy Notes  Date: 7.17.19 Time: 0950 Location: 500 Hall Dayroom  Group Topic: Self-Esteem  Goal Area(s) Addresses:  Patient will successfully identify positive attributes about themselves.  Patient will successfully identify benefit of improved self-esteem.   Behavioral Response: Engaged  Intervention: Scientist, clinical (histocompatibility and immunogenetics)Construction paper, scissors, glue sticks, magazines, markers, colored pencils  Activity: Collages. Patients were to create [name] that described and highlighted the positive things about them.  Education: Self-Esteem, Building control surveyorDischarge Planning.   Education Outcome: Acknowledges education/In group clarification offered/Needs additional education  Clinical Observations/Feedback: Pt was bright and active during group.  Pt created [name] fan for her father for his birthday.  Pt created fan with words such as gratitude, go- for vacations and her daughter's name.    Caroll RancherMarjette Adin [name], LRT/CTRS      Caroll RancherLindsay, Vicki [name] 08/30/2017 12:05 PM
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
## 2017-08-30 NOTE — Progress Notes (Signed)
  [facility] :  Will you be returning to the same living situation after discharge:  Yes,  home At discharge, do you have transportation home?: Yes,  family Do you have the ability to pay for your medications: Yes,  mental health  Release of information consent forms completed and in the chart;  Patient's signature needed at discharge.  Patient to Follow up at: Follow-up Information    Janace Littenlds, William B, MD Follow up.   Specialty:  Family Medicine Why:  Set up an appointment ASAP and ask for a referral to sleep study to be evaluated for Sleep Apnea Contact information: [address] [place] [address] [phone]        [facility] Follow up.   Why:  Go to the walk-in clinic within 3 days of d/c,M-Th between 8:30 and 11:30 for your hospital follow up appointment. Bring your ID and your hospital d/c paperwork Contact information: [address], [place], [place] [id number] [phone] F: [phone]          Next level of care provider has access to [facility] Link:no  Safety Planning and Suicide Prevention discussed: Yes,  yes  Have you used any form of tobacco in the last 30 days? (Cigarettes, Smokeless Tobacco, Cigars, and/or Pipes): Yes  Has patient been referred to the Quitline?: Patient refused referral  Patient has been referred for addiction treatment: N/A  Ida RogueRodney B Juanito Gonyer, LCSW 08/30/2017, 11:33 AM

## 2017-08-30 NOTE — [facility] Group Notes (Signed)
[facility] Group Notes:  (Nursing/MHT/Case Management/Adjunct)  Date:  08/30/2017  Time:  9:45 AM  Type of Therapy:  orientation and goals group  Participation Level:  Active  Participation Quality:  Appropriate  Affect:  Appropriate  Cognitive:  Alert  Insight:  Appropriate and Good  Engagement in Group:  Developing/Improving and Engaged  Modes of Intervention:  Clarification, Education and Orientation  Summary of Progress/Problems: Her goal for today is to go home and getting back outside and go home to see her girls who needs her very much.   Joanna Borawski J Merinda Victorino 08/30/2017, 9:45 AM

## 2017-09-02 ENCOUNTER — Ambulatory Visit (HOSPITAL_COMMUNITY)
Admission: RE | Admit: 2017-09-02 | Discharge: 2017-09-02 | Disposition: A | Discharge status: Home / Self Care | Payer: Medicaid Other | Attending: Psychiatry | Admitting: Psychiatry

## 2017-09-02 DIAGNOSIS — Z1330 Encounter for screening examination for mental health and behavioral disorders, unspecified: Secondary | ICD-10-CM | POA: Diagnosis not present

## 2017-09-02 DIAGNOSIS — F331 Major depressive disorder, recurrent, moderate: Secondary | ICD-10-CM | POA: Diagnosis not present

## 2017-09-02 NOTE — BH Assessment (Signed)
[facility]. She signed AOB forms, and as Clinical research associatewriter entered room to obtain V/S the patient stated '' is this mandatory that I stay? I really don't think I need to be here and I've changed my mind. '' Offered patient to stay and speak with NP about leaving and she stated '' no I really want to go now. '' Patient had indicated on form no SI / HI and when writer asked if patient was suicidal or homicidal she adamantly denied. Patient refused any MSE stating '' no I really don't want to do this. ''   Patient then walked back into the lobby five minutes later and agreed to be seen. TTS to complete assessment.

## 2017-09-02 NOTE — H&P (Signed)
Behavioral Health Medical Screening Exam  Vicki [name] is an 37 y.o. female. Present as a walk-in with mother-in-law for reported increased anxiety.  Patient was recently discharged 3 days ago from inpatient admission states she has not been able to pick up medications due to she is in the process of moving back to [place].  Reports she has a follow-up on Tuesday with primary care provider.  Denies suicidal homicidal ideations during this assessment.  Denies auditory or  visual hallucinations.  Reports she became stressed and overwhelmed this morning when dealing with her baby however is feeling a lot better this evening. Reports her mother in law requested that she comes in for an evaluation.  Support encouragement reassurance was provided.  Total Time spent with patient: 30 minutes  Psychiatric Specialty Exam: Physical Exam  Nursing note and vitals reviewed. Constitutional: She appears well-developed.  HENT:  Head: Normocephalic.  Neurological: She is alert.  Psychiatric: She has a normal mood and affect. Her behavior is normal.    Review of Systems  Psychiatric/Behavioral: Negative for depression and suicidal ideas. The patient is not nervous/anxious.   All other systems reviewed and are negative.   Last menstrual period 11/13/2016, unknown if currently breastfeeding.There is no height or weight on file to calculate BMI.  General Appearance: Casual  Eye Contact:  Fair  Speech:  Clear and Coherent  Volume:  Normal  Mood:  Anxious  Affect:  Congruent  Thought Process:  Coherent  Orientation:  Full (Time, Place, and Person)  Thought Content:  Hallucinations: None  Suicidal Thoughts:  No  Homicidal Thoughts:  No  Memory:  Immediate;   Fair Recent;   Fair Remote;   Fair  Judgement:  Fair  Insight:  Fair  Psychomotor Activity:  Normal  Concentration: Concentration: Fair  Recall:  Fis[employer]Fair  Fund of Knowledge:Fair  Language: Fair  Akathisia:  No  Handed:   Right  AIMS (if indicated):     Assets:  Communication Skills Desire for Improvement Resilience Social Support  Sleep:       Musculoskeletal: Strength & Muscle Tone: within normal limits Gait & Station: normal Patient leans: N/A  Last menstrual period 11/13/2016, unknown if currently breastfeeding.  vitals: B/p 133/86 HR 88 RR 18 sat 99%RM  Temp 98.3 Recommendations:  Based on my evaluation the patient does not appear to have an emergency medical condition.  Oneta Rackanika N Marvis Bakken, NP 09/02/2017, 4:17 PM

## 2017-09-02 NOTE — BH Assessment (Signed)
Assessment Note  Vicki [name] is an 37 y.o. female, who [facility]ouse and Mother-in-Law.  Patient was orientated x4, mood "anxious and a little depressed" affect was congruent with mood.  Patient denied current SI, HI, AVH.  Patient had a scheduled c-section on 08-13-2017 and [facility]-2017 for MDD, single episode with psychotic features.  Patient reports feeling overwhelmed with a move from [place] into [place] new home along with caring for infant Daughter.  Patient reports being prescribed Alprazolam by PCP for anxiety and not taking it for the past 2 days.  She reports a history of depression and rates level as "3" on a 10 point scale today.  Patient also reports plans to follow up with scheduled outpatient mental health appointment made during hospital discharge planning on 09-05-2017.  Patient's Mother-in-Law, Vicki [name], was present and provided collateral information.  Mrs. Dimas AguasHoward reports overall the Patient is feeling overwhelmed and was saying irrational things earlier today and that is the reason she and her Son [facility].  Mrs. Dimas AguasHoward reports observing and interacting with Patient since her hospital discharge on 08-30-2017 and not observing or being told by Patient that she was experiencing suicidal or homicidal thoughts, plans, or intent or hallucinations or paranoia.  Mrs. Dimas AguasHoward reports she and her Son are supportive of the Patient and that currently the Son has taken off from work to assist with the moving and care of infant Daughter.  Patient requested to leave and not be hospitalized and Mrs. Dimas AguasHoward reports no safety concerns about the Patient.    Per Vicki Jacksanika Lewis, NP;  Patient does not meet inpatient criteria.       Diagnosis:  Major Depressive Disorder, recurrent, moderate  Past Medical History:  Past Medical History:  Diagnosis Date  . Anxiety   . Back pain   . Degenerative disc disease, lumbar   .  History of ectopic pregnancy   . Methadone maintenance therapy patient (HCC)   . Opiate dependence (HCC)   . Substance abuse [facility](HCC)     Past Surgical History:  Procedure Laterality Date  . CESAREAN SECTION    . CESAREAN SECTION N/A 08/13/2017   Procedure: REPEAT CESAREAN SECTION;  Surgeon: Hermina StaggersErvin, Michael L, MD;  [facility];  Service: Obstetrics;  Laterality: N/A;  . CHOLECYSTECTOMY      Family History:  Family History  Problem Relation Age of Onset  . Hypertension Mother   . Thyroid disease Father   . Hypertension Father   . Diabetes Father     Social History:  reports that she has been smoking cigarettes and e-cigarettes.  She has never used smokeless tobacco. She reports that she has current or past drug history. Drug: Oxycodone. She reports that she does not drink alcohol.  Additional Social History:     CIWA:   COWS:    Allergies:  Allergies  Allergen Reactions  . Amoxicillin Hives    Has patient had a PCN reaction causing immediate rash, facial/tongue/throat swelling, SOB or lightheadedness with hypotension: No Has patient had a PCN reaction causing severe rash involving mucus membranes or skin necrosis: No Has patient had a PCN reaction that required hospitalization: No Has patient had a PCN reaction occurring within the last 10 years: No If all of the above answers are "NO", then may proceed with Cephalosporin use.   [name]. Strawberry (Diagnostic) Hives  . Tramadol Hives  Home Medications:  (Not in a hospital admission)  OB/GYN Status:  Patient's last menstrual period was 11/13/2016.  General Assessment Data Location of Assessment: [facility] Services TTS Assessment: In system Is this a Tele or Face-to-Face Assessment?: Face-to-Face Is this an Initial Assessment or a Re-assessment for this encounter?: Initial Assessment Marital status: Married [place] name: Fodge Is patient pregnant?: No Pregnancy Status: No Living Arrangements:  Spouse/significant other, Children Can pt return to current living arrangement?: Yes Admission Status: Voluntary Is patient capable of signing voluntary admission?: Yes Referral Source: Self/Family/Friend(Spouse and Mother-in-Law) Insurance type: Medicaid  Medical Screening Exam [facility] Walk-in ONLY) Medical Exam completed: No Reason for MSE not completed: Other:([facility] Walk in)  Crisis Care Plan Living Arrangements: Spouse/significant other, Children Name of Psychiatrist: None Name of Therapist: (Patient has first appoitment on 09-05-2017)  Education Status Is patient currently in school?: No Highest grade of school patient has completed: Has only a few credits left to get her Bachelor's Is the patient employed, unemployed or receiving disability?: Unemployed  Risk to self with the past 6 months Suicidal Ideation: No Has patient been a risk to self within the past 6 months prior to admission? : Yes Suicidal Intent: No Has patient had any suicidal intent within the past 6 months prior to admission? : No Is patient at risk for suicide?: No Suicidal Plan?: No Has patient had any suicidal plan within the past 6 months prior to admission? : No Access to Means: No What has been your use of drugs/alcohol within the last 12 months?: Patient denied Previous Attempts/Gestures: No How many times?: 0 Other Self Harm Risks: None Triggers for Past Attempts: None known Intentional Self Injurious Behavior: None Family Suicide History: No Recent stressful life event(s): Other (Comment)(Moving and infant Daughter) Persecutory voices/beliefs?: No Depression: Yes Depression Symptoms: (feeling overwhelmed) Substance abuse history and/or treatment for substance abuse?: No Suicide prevention information given to non-admitted patients: Not applicable  Risk to Others within the past 6 months Homicidal Ideation: No Does patient have any lifetime risk of violence toward others beyond the six months  prior to admission? : Yes (comment) Thoughts of Harm to Others: No Comment - Thoughts of Harm to Others: N/A Current Homicidal Intent: No Current Homicidal Plan: No Access to Homicidal Means: No Identified Victim: N/A History of harm to others?: No Assessment of Violence: None Noted Violent Behavior Description: None Does patient have access to weapons?: No Criminal Charges Pending?: No Does patient have a court date: No Is patient on probation?: No  Psychosis Hallucinations: None noted Delusions: None noted  Mental Status Report Appearance/Hygiene: Unremarkable Eye Contact: Fair Motor Activity: Unremarkable Speech: Logical/coherent Level of Consciousness: Alert Mood: Depressed, Anxious Affect: Anxious, Appropriate to circumstance Anxiety Level: Moderate Thought Processes: Coherent, Relevant Judgement: Unimpaired Orientation: Person, Place, Time, Situation Obsessive Compulsive Thoughts/Behaviors: None  Cognitive Functioning Concentration: Normal Memory: Recent Intact, Remote Intact Is patient IDD: No Is patient DD?: No Insight: Good Impulse Control: Fair Appetite: Good Have you had any weight changes? : Loss Amount of the weight change? (lbs): 10 lbs(losing pregnancy wieght) Sleep: No Change Total Hours of Sleep: 6 Vegetative Symptoms: None  ADLScreening [facility] Assessment Services) Patient's cognitive ability adequate to safely complete daily activities?: Yes Patient able to express need for assistance with ADLs?: Yes Independently performs ADLs?: Yes (appropriate for developmental age)  Prior Inpatient Therapy Prior Inpatient Therapy: Yes Prior Therapy Dates: 08-25-2017 to 08-30-2017 [facility] Reason for Treatment: MDD  Prior Outpatient Therapy Prior Outpatient Therapy: Yes  Prior Therapy Dates: Age 75 - 85 Prior Therapy Facilty/Provider(s): A provider in [place] Reason for Treatment: Depression Does patient have an ACCT team?:  No Does patient have Intensive In-House Services?  : No Does patient have [facility] services? : No Does patient have P4CC services?: No  ADL Screening (condition at time of admission) Patient's cognitive ability adequate to safely complete daily activities?: Yes Is the patient deaf or have difficulty hearing?: No Does the patient have difficulty seeing, even when wearing glasses/contacts?: No Does the patient have difficulty concentrating, remembering, or making decisions?: No Patient able to express need for assistance with ADLs?: Yes Does the patient have difficulty dressing or bathing?: No Independently performs ADLs?: Yes (appropriate for developmental age) Does the patient have difficulty walking or climbing stairs?: No Weakness of Legs: None Weakness of Arms/Hands: None  Home Assistive Devices/Equipment Home Assistive Devices/Equipment: None    Abuse/Neglect Assessment (Assessment to be complete while patient is alone) Abuse/Neglect Assessment Can Be Completed: Yes Physical Abuse: Denies Verbal Abuse: Denies Sexual Abuse: Denies Exploitation of patient/patient's resources: Denies Self-Neglect: Denies Values / Beliefs Cultural Requests During Hospitalization: None Spiritual Requests During Hospitalization: None   Advance Directives (For Healthcare) Would patient like information on creating a medical advance directive?: No - Patient declined          Disposition:  Disposition Initial Assessment Completed for this Encounter: Yes Patient referred to: Outpatient clinic referral  On Site Evaluation by:   Reviewed with Physician:    Dey-Johnson,Kerrilyn Azbill 09/02/2017 4:48 PM

## 2017-10-10 ENCOUNTER — Ambulatory Visit: Payer: Self-pay | Admitting: Obstetrics and Gynecology

## 2017-12-28 ENCOUNTER — Other Ambulatory Visit: Payer: Self-pay

## 2017-12-28 ENCOUNTER — Encounter: Payer: Self-pay | Admitting: Physician Assistant

## 2017-12-28 ENCOUNTER — Ambulatory Visit: Payer: Medicaid Other | Admitting: Physician Assistant

## 2017-12-28 VITALS — BP 100/72 | HR 72 | Temp 98.1°F | Resp 16 | Ht 67.0 in | Wt 216.8 lb

## 2017-12-28 DIAGNOSIS — Z23 Encounter for immunization: Secondary | ICD-10-CM | POA: Diagnosis not present

## 2017-12-28 DIAGNOSIS — S0990XS Unspecified injury of head, sequela: Secondary | ICD-10-CM

## 2017-12-28 DIAGNOSIS — F339 Major depressive disorder, recurrent, unspecified: Secondary | ICD-10-CM | POA: Diagnosis not present

## 2017-12-28 DIAGNOSIS — Z30011 Encounter for initial prescription of contraceptive pills: Secondary | ICD-10-CM

## 2017-12-28 DIAGNOSIS — Z765 Malingerer [conscious simulation]: Secondary | ICD-10-CM

## 2017-12-28 DIAGNOSIS — Z8739 Personal history of other diseases of the musculoskeletal system and connective tissue: Secondary | ICD-10-CM

## 2017-12-28 DIAGNOSIS — G44309 Post-traumatic headache, unspecified, not intractable: Secondary | ICD-10-CM | POA: Diagnosis not present

## 2017-12-28 DIAGNOSIS — E0590 Thyrotoxicosis, unspecified without thyrotoxic crisis or storm: Secondary | ICD-10-CM

## 2017-12-28 MED ORDER — NORETHINDRONE 0.35 MG PO TABS: 1.0000 | ORAL_TABLET | Freq: Every day | ORAL | 4 refills | Status: AC

## 2017-12-28 MED ORDER — SERTRALINE HCL 50 MG PO TABS: 50.0000 mg | ORAL_TABLET | Freq: Every day | ORAL | 0 refills | Status: AC
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
## 2017-12-28 NOTE — Patient Instructions (Signed)

## 2020-01-27 IMAGING — US US MFM OB FOLLOW-UP
1 series · 13 of 28 positions shown · non-contrast
Comparison: none

[Series 1: us mfm ob follow-up · 13 of 55 slices shown]
[im 3/55]
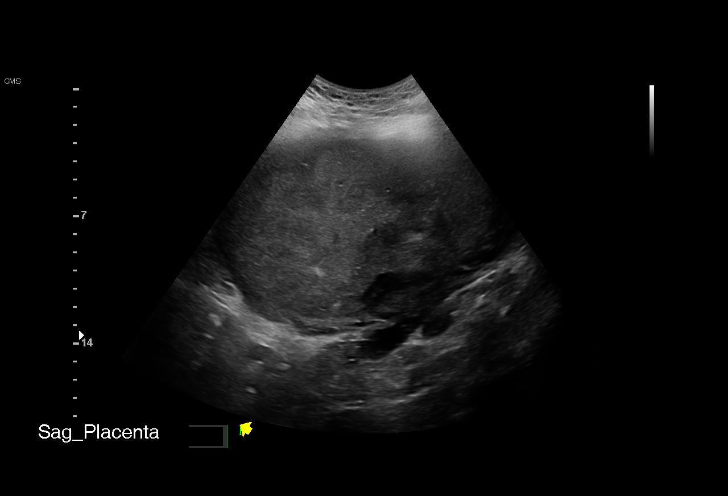
[im 7/55]
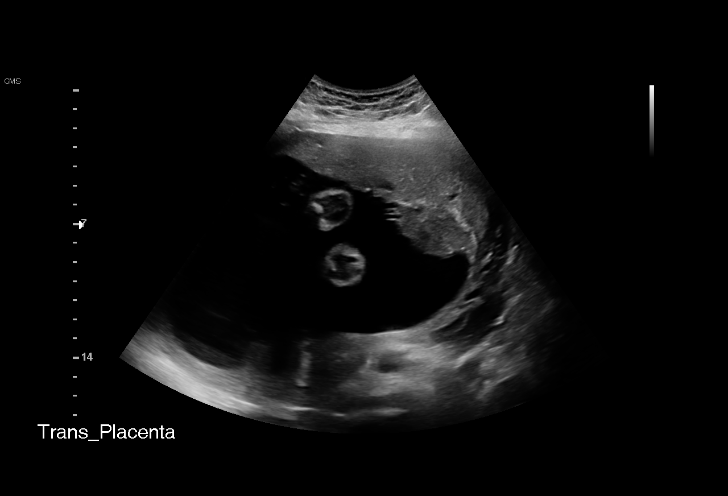
[im 11/55]
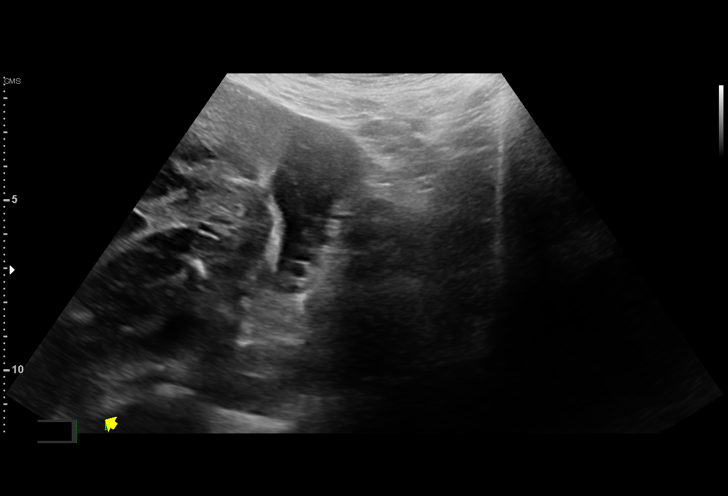
[im 15/55]
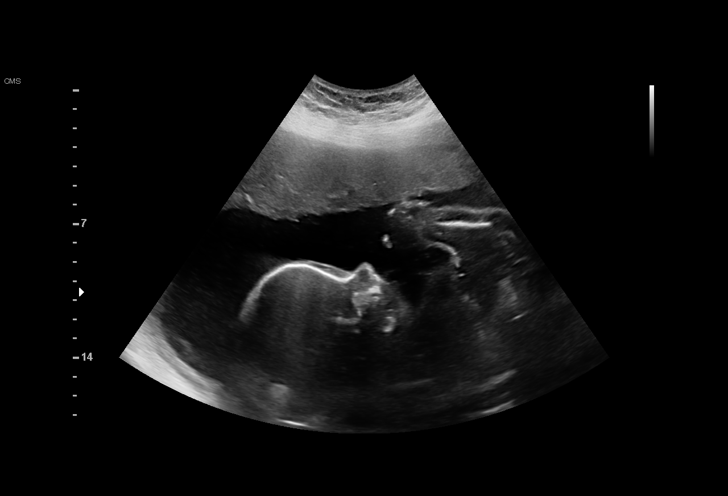
[im 19/55]
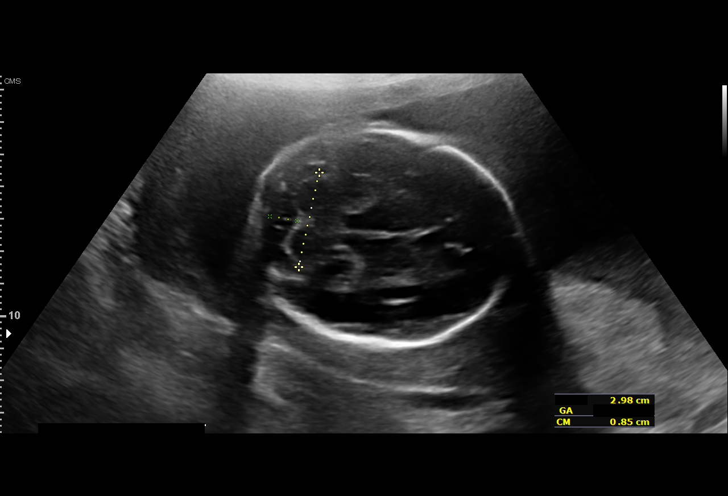
[im 23/55]
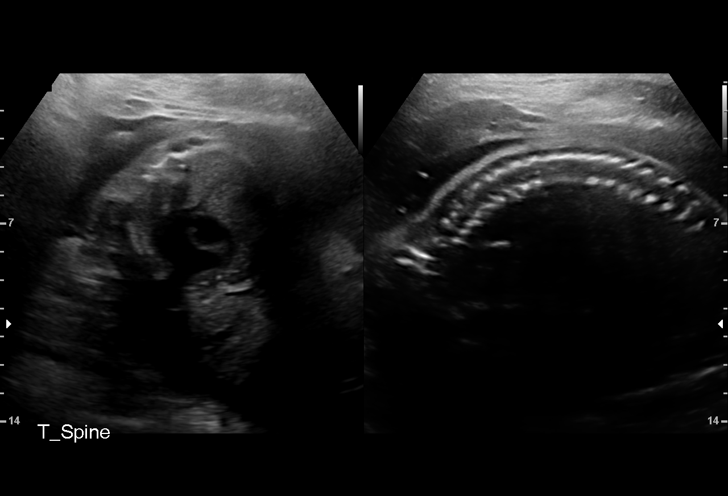
[im 29/55]
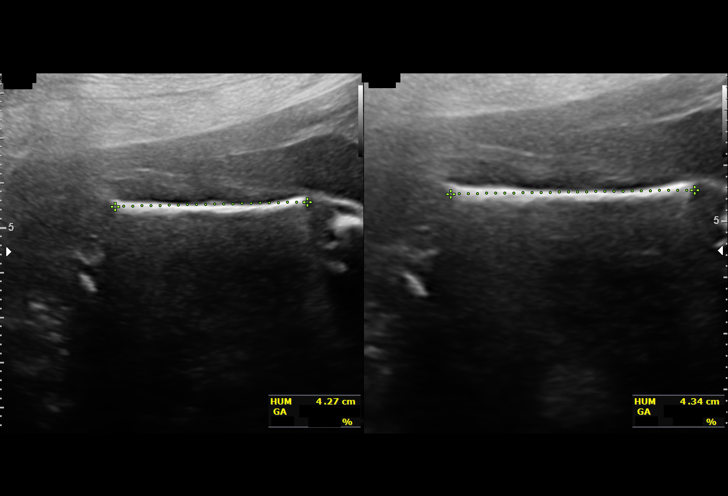
[im 33/55]
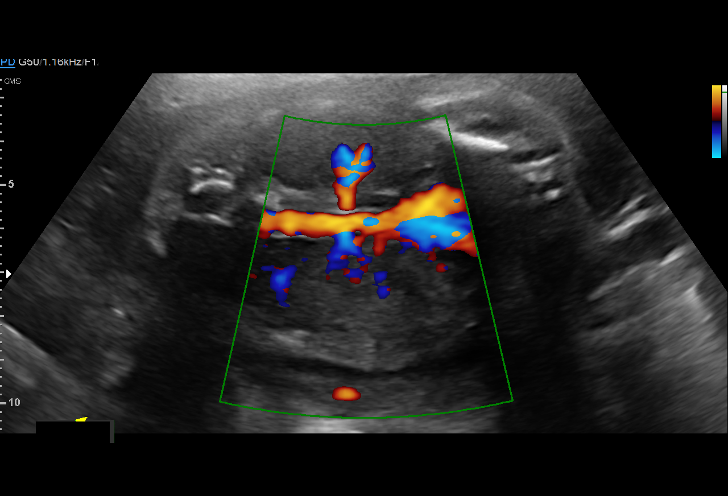
[im 37/55]
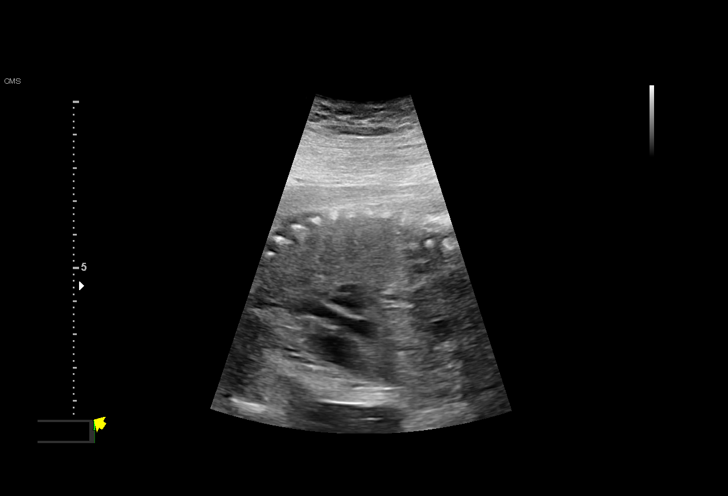
[im 41/55]
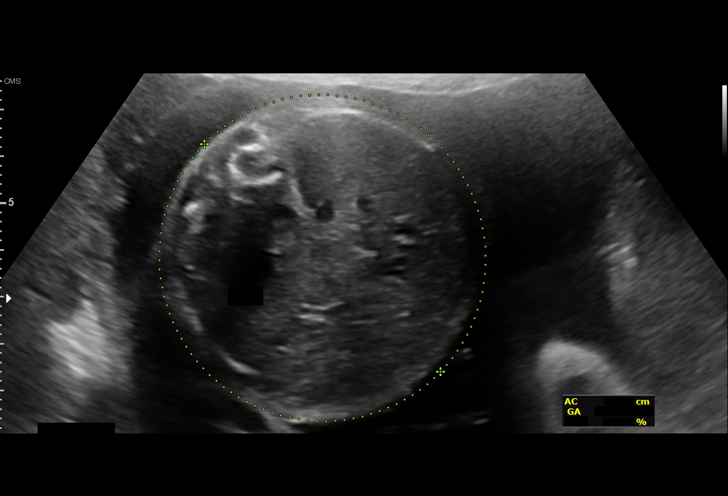
[im 45/55]
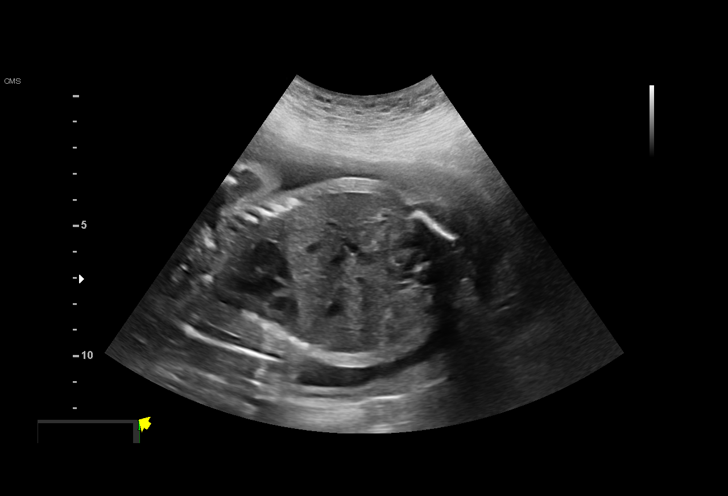
[im 49/55]
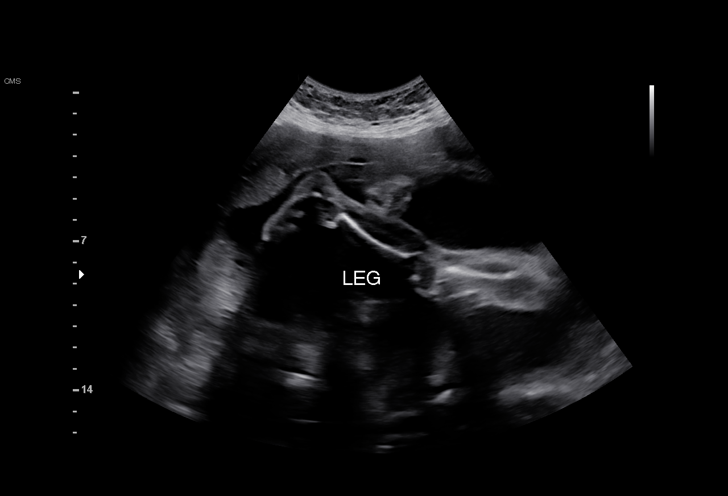
[im 53/55]
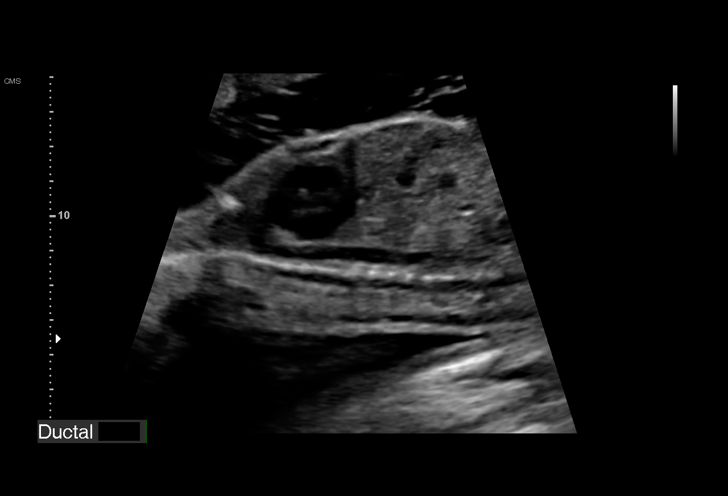

[13 of 28 positions shown; findings below may reference images not displayed]

1  DAMIEN VIEYRA              [id number]      [id number]     [id number]
Indications

26 weeks gestation of pregnancy
Obesity complicating pregnancy, second
trimester
Advanced maternal age multigravida 35+,
second trimester (low risk- Panorama)
Previous cesarean delivery, antepartum
Substance abuse affecting pregnancy,
antepartum (methadone)
OB History

Blood Type:            Height:  5'7"   Weight (lb):  217       BMI:
Gravidity:    8         Term:   1         SAB:   5
Ectopic:      1        Living:  1
Fetal Evaluation

Num Of Fetuses:     1
Fetal Heart         140
Rate(bpm):
Cardiac Activity:   Observed
Presentation:       Variable
Placenta:           Anterior Left, above cervical os
P. Cord Insertion:  Visualized

Amniotic Fluid
AFI FV:      Subjectively within normal limits

Largest Pocket(cm)
8.55
Biometry

BPD:      65.6  mm     G. Age:  26w 3d         36  %    CI:        69.21   %    70 - 86
FL/HC:      18.5   %    18.6 -
HC:      251.8  mm     G. Age:  27w 3d         50  %    HC/AC:      1.14        1.05 -
AC:       221   mm     G. Age:  26w 4d         41  %    FL/BPD:     71.0   %    71 - 87
FL:       46.6  mm     G. Age:  25w 4d         11  %    FL/AC:      21.1   %    20 - 24
HUM:        43  mm     G. Age:  25w 5d         26  %

Est. FW:     911  gm           2 lb     47  %
Gestational Age

LMP:           26w 4d        Date:  11/13/16                 EDD:   08/20/17
U/S Today:     26w 4d                                        EDD:   08/20/17
Best:          26w 4d     Det. By:  LMP  (11/13/16)          EDD:   08/20/17
Anatomy

Cranium:               Appears normal         Aortic Arch:            Previously seen
Cavum:                 Appears normal         Ductal Arch:            Appears normal
Ventricles:            Appears normal         Diaphragm:              Appears normal
Choroid Plexus:        Appears normal         Stomach:                Appears normal, left
sided
Cerebellum:            Appears normal         Abdomen:                Appears normal
Posterior Fossa:       Appears normal         Abdominal Wall:         Previously seen
Nuchal Fold:           Not applicable (>20    Cord Vessels:           Previously seen
wks GA)
Face:                  Profile nl; orbits     Kidneys:                Appear normal
previously visualized
Lips:                  Previously seen        Bladder:                Appears normal
Thoracic:              Appears normal         Spine:                  Appears normal
Heart:                 Previously seen        Upper Extremities:      Previously seen
RVOT:                  Appears normal         Lower Extremities:      Previously seen
LVOT:                  Appears normal

Other:  Female gender previously seen. Heels and 5th digit previously
visualized. Nasal bone visualized. Technically difficult due to maternal
habitus and fetal position.
Cervix Uterus Adnexa

Cervix
Length:            4.5  cm.
Normal appearance by transabdominal scan.

Uterus
No abnormality visualized.

Left Ovary
Not visualized.

Right Ovary
Not visualized.

Cul De Sac:   No free fluid seen.

Adnexa:       No abnormality visualized.
Impression

Single living intrauterine pregnancy at 26w 4d.
Variable presentation.
Placenta Anterior Left, above cervical os.
Appropriate fetal growth.
Normal amniotic fluid volume.
The fetal anatomic survey is complete.
Normal appearing interval fetal anatomy.
The adnexa not visualized.
The cervix measures 4.5cm on transabdominal imaging
without funneling.
Recommendations

Recommend follow-up ultrasound examination serial
ultrasounds for fetal growth

## 2020-03-23 IMAGING — US US MFM OB FOLLOW-UP
1 series · 12 of 28 positions shown · non-contrast
Comparison: none

[Series 1: us mfm ob follow-up · 12 of 32 slices shown]
[im 2/32]
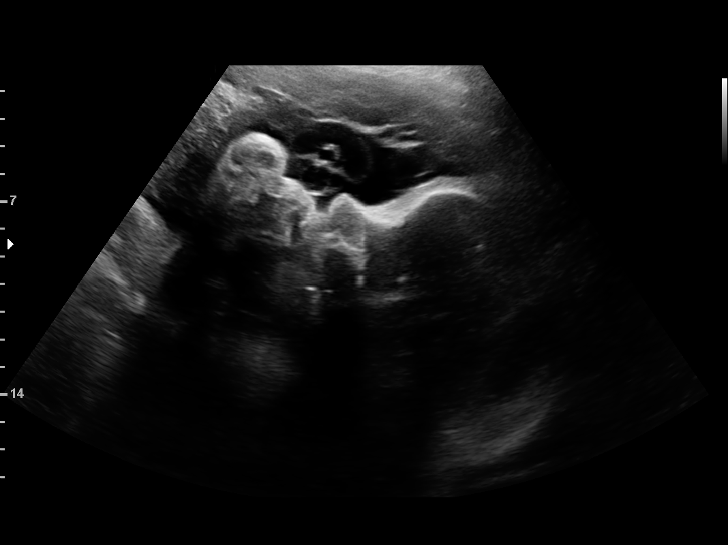
[im 4/32]
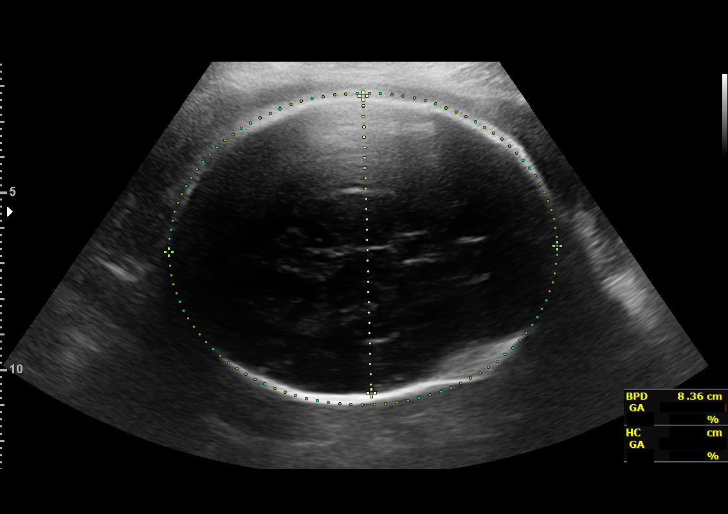
[im 6/32]
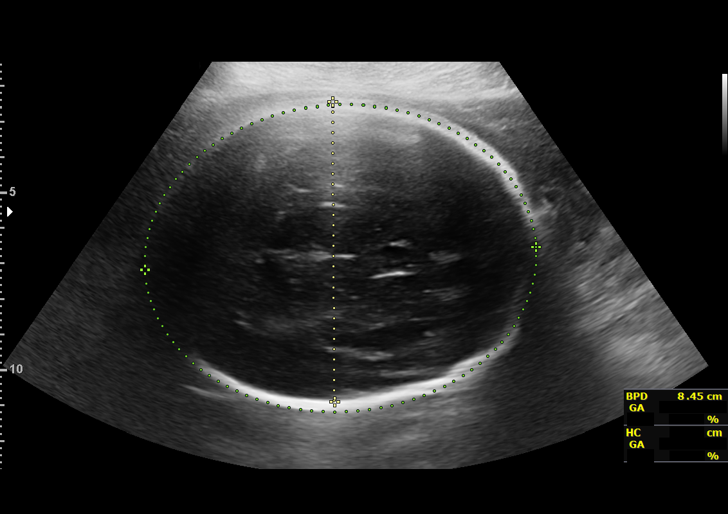
[im 10/32]
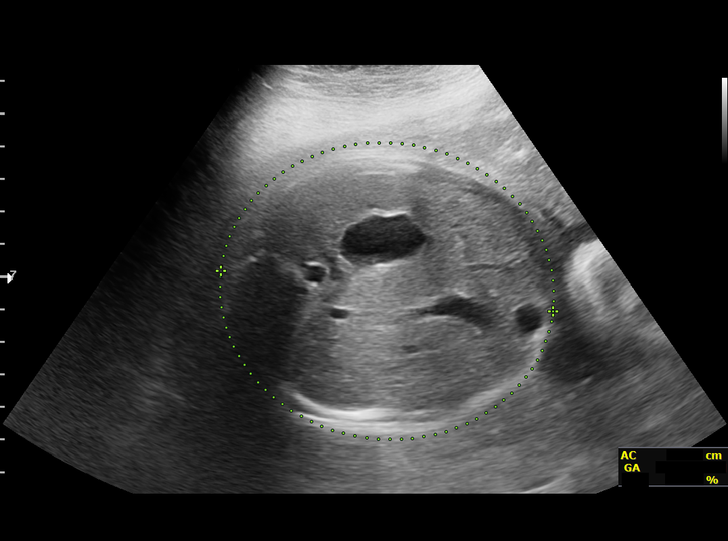
[im 12/32]
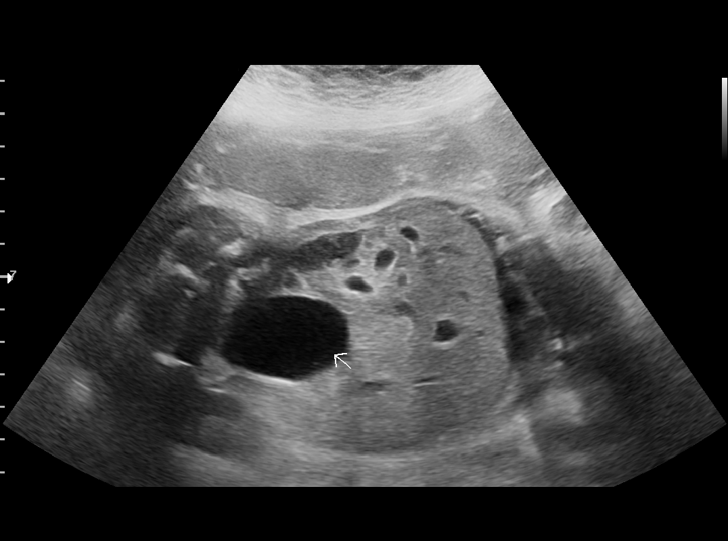
[im 14/32]
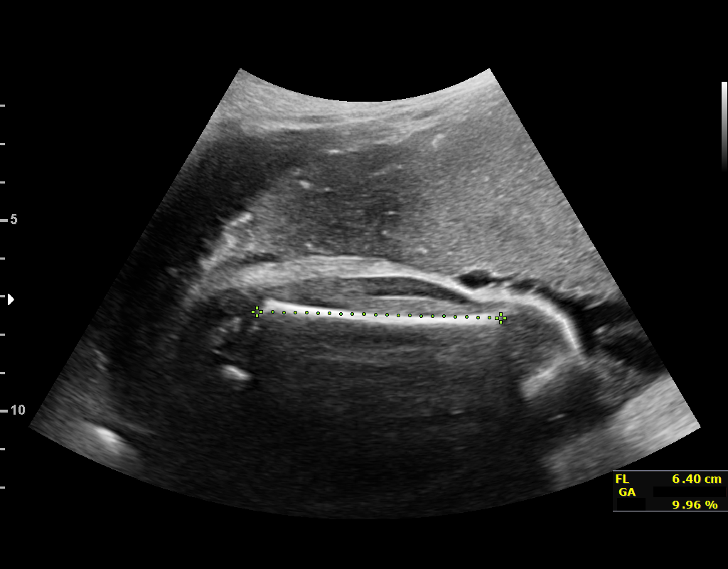
[im 18/32]
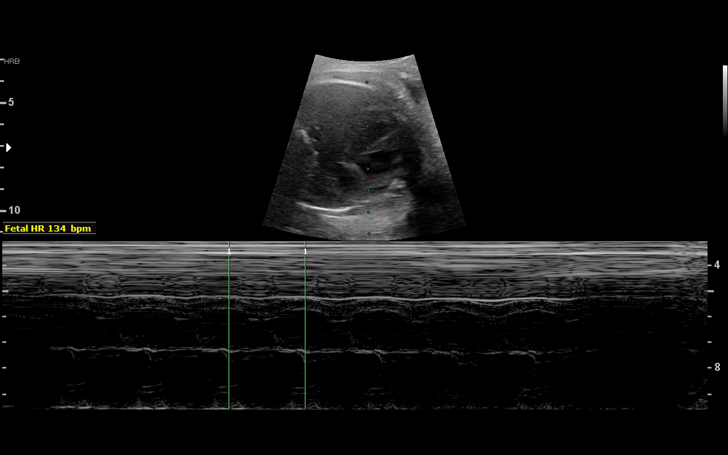
[im 20/32]
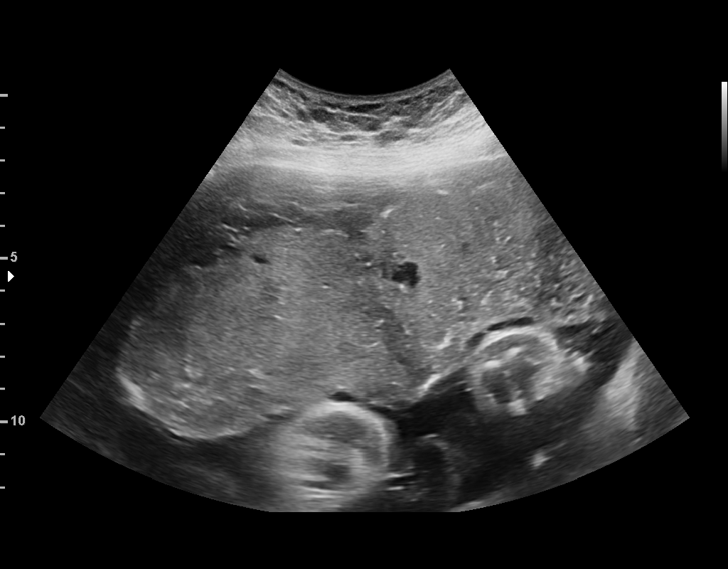
[im 22/32]
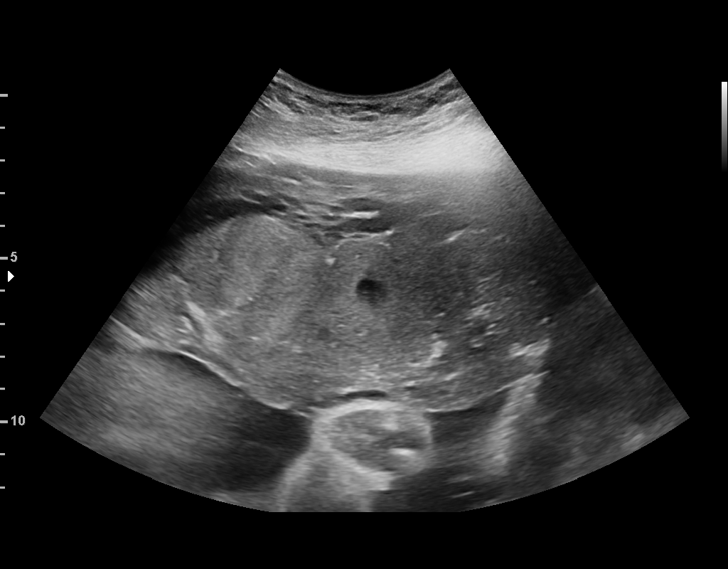
[im 26/32]
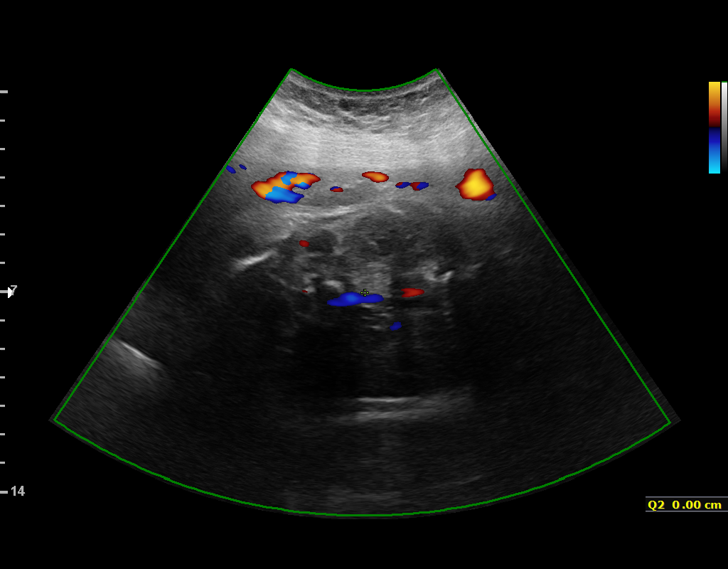
[im 28/32]
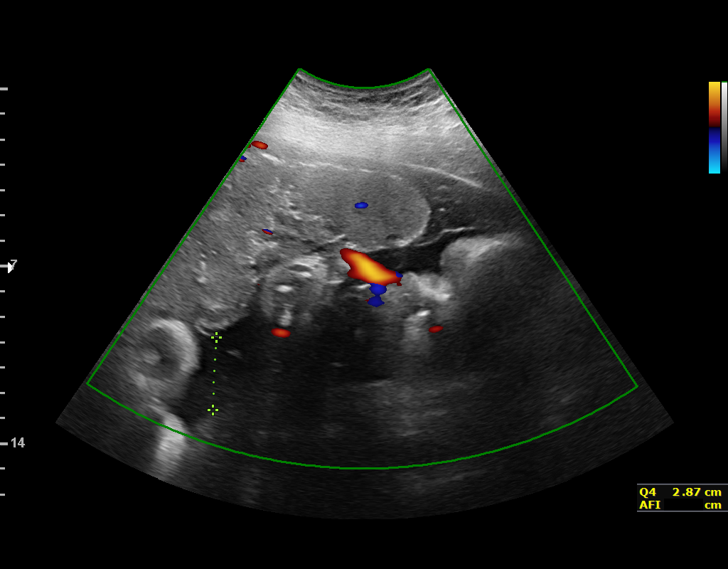
[im 30/32]
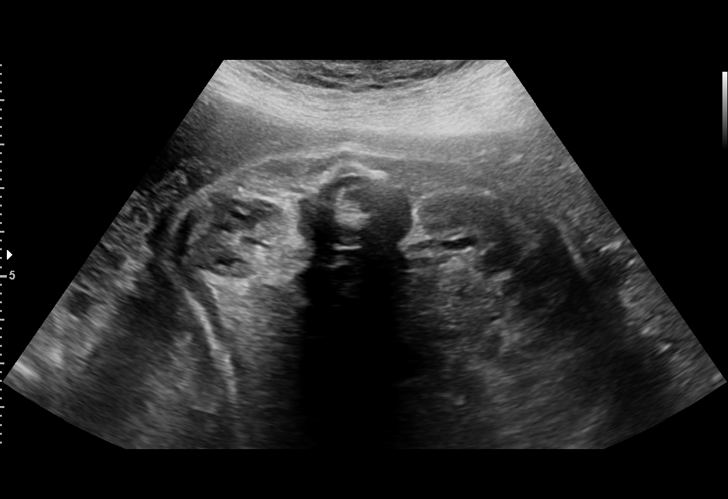

[12 of 28 positions shown; findings below may reference images not displayed]

1  HERNAN ALBERTO EDUAR            [id number]      [id number]     [id number]
Indications

34 weeks gestation of pregnancy
Obesity complicating pregnancy, third
trimester
Advanced maternal age multigravida 35+,
third trimester (low risk- Panorama)
Previous cesarean delivery, antepartum
Substance abuse affecting pregnancy,
antepartum (methadone)
OB History

Blood Type:            Height:  5'7"   Weight (lb):  217       BMI:
Gravidity:    8         Term:   1         SAB:   5
Ectopic:      1        Living:  1
Fetal Evaluation

Num Of Fetuses:     1
Fetal Heart         134
Rate(bpm):
Cardiac Activity:   Observed
Presentation:       Cephalic
Placenta:           Anterior, above cervical os
P. Cord Insertion:  Previously Visualized

Amniotic Fluid
AFI FV:      Subjectively within normal limits

AFI Sum(cm)     %Tile       Largest Pocket(cm)
11.74           33

RUQ(cm)       RLQ(cm)       LUQ(cm)        LLQ(cm)
4.57          2.87          0
Biometry

BPD:      84.1  mm     G. Age:  33w 6d         29  %    CI:        74.24   %    70 - 86
FL/HC:      20.7   %    20.1 -
HC:      309.9  mm     G. Age:  34w 4d         19  %    HC/AC:      1.01        0.93 -
AC:      306.1  mm     G. Age:  34w 4d         55  %    FL/BPD:     76.5   %    71 - 87
FL:       64.3  mm     G. Age:  33w 2d         12  %    FL/AC:      21.0   %    20 - 24
HUM:      54.9  mm     G. Age:  32w 0d          9  %

Est. FW:    1611  gm      5 lb 3 oz     51  %
Gestational Age

LMP:           34w 4d        Date:  11/13/16                 EDD:   08/20/17
U/S Today:     34w 1d                                        EDD:   08/23/17
Best:          34w 4d     Det. By:  LMP  (11/13/16)          EDD:   08/20/17
Anatomy

Cranium:               Appears normal         Aortic Arch:            Previously seen
Cavum:                 Appears normal         Ductal Arch:            Previously seen
Ventricles:            Previously seen        Diaphragm:              Appears normal
Choroid Plexus:        Previously seen        Stomach:                Appears normal, left
sided
Cerebellum:            Previously seen        Abdomen:                Appears normal
Posterior Fossa:       Previously seen        Abdominal Wall:         Previously seen
Nuchal Fold:           Not applicable (>20    Cord Vessels:           Previously seen
wks GA)
Face:                  Orbits and profile     Kidneys:                Appear normal
previously seen
Lips:                  Previously seen        Bladder:                Appears normal
Thoracic:              Appears normal         Spine:                  Previously seen
Heart:                 Previously seen        Upper Extremities:      Previously seen
RVOT:                  Previously seen        Lower Extremities:      Previously seen
LVOT:                  Previously seen

Other:  Female gender previously seen. Heels, 5th digit, and Nasal bone
previously visualized. Technically difficult due to maternal habitus and
fetal position.
Cervix Uterus Adnexa

Cervix
Not visualized (advanced GA >98wks)
Impression

SIUP at 34+4 weeks
Cephalic presentation
Normal interval anatomy; anatomic survey complete
Normal amniotic fluid volume
Appropriate interval growth with EFW at the 51st %tile
Recommendations

Follow-up as clinically indicated

## 2020-03-26 ENCOUNTER — Ambulatory Visit: Payer: Medicaid Other | Admitting: Obstetrics and Gynecology

## 2020-04-27 ENCOUNTER — Encounter: Payer: Self-pay | Admitting: Obstetrics and Gynecology

## 2020-04-27 ENCOUNTER — Ambulatory Visit: Payer: Medicaid Other | Admitting: Obstetrics and Gynecology

## 2020-04-28 NOTE — Progress Notes (Signed)
Patient did not keep her GYN appointment for 04/27/2020.  Cornelia Copa MD Attending [facility] for [employer])

## 2020-12-03 ENCOUNTER — Encounter: Payer: Self-pay | Admitting: Neurology

## 2021-01-01 ENCOUNTER — Encounter: Payer: Self-pay | Admitting: Neurology

## 2021-01-15 ENCOUNTER — Encounter: Payer: Self-pay | Admitting: Neurology

## 2021-01-15 ENCOUNTER — Other Ambulatory Visit: Payer: Self-pay

## 2021-01-15 ENCOUNTER — Ambulatory Visit: Payer: Medicaid Other | Admitting: Neurology

## 2021-01-15 VITALS — BP 95/60 | HR 78 | Ht 67.0 in | Wt 258.6 lb

## 2021-01-15 DIAGNOSIS — R4189 Other symptoms and signs involving cognitive functions and awareness: Secondary | ICD-10-CM | POA: Diagnosis not present

## 2021-01-15 DIAGNOSIS — Z87820 Personal history of traumatic brain injury: Secondary | ICD-10-CM

## 2021-01-15 NOTE — Patient Instructions (Signed)
MRI brain without contrast Routine EEG Neurocognitive evaluation Follow up after testing
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
## 2021-01-19 ENCOUNTER — Other Ambulatory Visit: Payer: Self-pay

## 2021-01-19 DIAGNOSIS — Z87820 Personal history of traumatic brain injury: Secondary | ICD-10-CM

## 2021-01-25 ENCOUNTER — Other Ambulatory Visit: Payer: Self-pay

## 2021-01-25 ENCOUNTER — Ambulatory Visit: Payer: Medicaid Other | Admitting: Neurology

## 2021-01-25 DIAGNOSIS — Z87820 Personal history of traumatic brain injury: Secondary | ICD-10-CM

## 2021-01-26 NOTE — Procedures (Signed)
ELECTROENCEPHALOGRAM REPORT  Date of Study: 01/25/2021  Patient's Name: ALMENA HOKENSON MRN: [id number] Date of Birth: 29-Apr-1980  Referring Provider: Yehuda Savannah, MD  Clinical History: 40 year old female with history of multiple concussions presents with recurrent staring spells.  Medications: Xanax Sertraline Oxycodone Dolophine Camila  Technical Summary: A multichannel digital EEG recording measured by the international 10-20 system with electrodes applied with paste and impedances below 5000 ohms performed in our laboratory with EKG monitoring in an awake and drowsy patient.  Photic stimulation was performed.  The digital EEG was referentially recorded, reformatted, and digitally filtered in a variety of bipolar and referential montages for optimal display.    Description: The patient is awake and drowsy during the recording.  During maximal wakefulness, there is a symmetric, medium voltage 11 Hz posterior dominant rhythm that attenuates with eye opening.  The record is symmetric.  During drowsiness, there is an increase in theta slowing of the background.  Stage 2 sleep was not seen.  Photic stimulation did not elicit any abnormalities.  There were no epileptiform discharges or electrographic seizures seen.    EKG lead was unremarkable.  Impression: This awake and drowsy EEG is normal.    Clinical Correlation: A normal EEG does not exclude a clinical diagnosis of epilepsy.  If further clinical questions remain, prolonged EEG may be helpful.  Clinical correlation is advised.   Shon Millet, DO

## 2021-02-04 ENCOUNTER — Other Ambulatory Visit: Payer: Self-pay | Admitting: Neurology

## 2021-02-04 DIAGNOSIS — Z87820 Personal history of traumatic brain injury: Secondary | ICD-10-CM

## 2021-02-16 ENCOUNTER — Other Ambulatory Visit: Payer: Medicaid Other

## 2021-03-02 ENCOUNTER — Inpatient Hospital Stay: Admission: RE | Admit: 2021-03-02 | Payer: Medicaid Other | Source: Ambulatory Visit

## 2021-03-05 ENCOUNTER — Ambulatory Visit: Payer: Medicaid Other | Admitting: Neurology

## 2021-03-16 ENCOUNTER — Inpatient Hospital Stay: Admission: RE | Admit: 2021-03-16 | Payer: Medicaid Other | Source: Ambulatory Visit

## 2021-03-31 ENCOUNTER — Telehealth: Payer: Self-pay | Admitting: Neurology

## 2021-03-31 NOTE — Telephone Encounter (Signed)
Vicki [name] from Dr Ardyth Harps B office called .  She needs the notes from [facility] initial referral back in December to close the referral out.The fax number is [phone].

## 2021-03-31 NOTE — Telephone Encounter (Signed)
Records routed to referring provider via epic.

## 2021-04-20 ENCOUNTER — Ambulatory Visit
Admission: RE | Admit: 2021-04-20 | Discharge: 2021-04-20 | Disposition: A | Discharge status: Home / Self Care | Payer: Medicaid Other | Source: Ambulatory Visit | Attending: Neurology | Admitting: Neurology

## 2021-04-20 DIAGNOSIS — Z87820 Personal history of traumatic brain injury: Secondary | ICD-10-CM

## 2021-04-20 MED ORDER — GADOBENATE DIMEGLUMINE 529 MG/ML IV SOLN
20.0000 mL | Freq: Once | INTRAVENOUS | Status: AC | PRN
  Administered 2021-04-20: 20 mL via INTRAVENOUS

## 2021-07-01 ENCOUNTER — Ambulatory Visit: Payer: Medicaid Other

## 2021-07-01 ENCOUNTER — Ambulatory Visit (INDEPENDENT_AMBULATORY_CARE_PROVIDER_SITE_OTHER): Payer: Medicaid Other | Admitting: Psychology

## 2021-07-01 ENCOUNTER — Encounter: Payer: Self-pay | Admitting: Psychology

## 2021-07-01 DIAGNOSIS — R4189 Other symptoms and signs involving cognitive functions and awareness: Secondary | ICD-10-CM

## 2021-07-01 DIAGNOSIS — F411 Generalized anxiety disorder: Secondary | ICD-10-CM

## 2021-07-01 DIAGNOSIS — F332 Major depressive disorder, recurrent severe without psychotic features: Secondary | ICD-10-CM

## 2021-07-01 DIAGNOSIS — S060X1S Concussion with loss of consciousness of 30 minutes or less, sequela: Secondary | ICD-10-CM | POA: Diagnosis not present

## 2021-07-01 DIAGNOSIS — M545 Low back pain, unspecified: Secondary | ICD-10-CM | POA: Insufficient documentation

## 2021-07-01 DIAGNOSIS — Z87820 Personal history of traumatic brain injury: Secondary | ICD-10-CM | POA: Insufficient documentation

## 2021-07-01 DIAGNOSIS — G4752 REM sleep behavior disorder: Secondary | ICD-10-CM | POA: Insufficient documentation

## 2021-07-01 DIAGNOSIS — M79609 Pain in unspecified limb: Secondary | ICD-10-CM | POA: Insufficient documentation

## 2021-07-01 DIAGNOSIS — F4310 Post-traumatic stress disorder, unspecified: Secondary | ICD-10-CM

## 2021-07-01 NOTE — Progress Notes (Signed)
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 Score Percentile   List Learning       Total Trials 1-3 24/36 (46) 34 Average    Short Delay Free Recall 8/12 (47) 38 Average    Long Delay Free Recall 7/12 (43) 25 Average    Recognition Discriminability 9 (51) 54 Average  Shape Learning       Total Trials 1-3 16/27 (45) 31 Average    Delayed Recall 6/9 (46) 34 Average    Recognition Discriminability 7 (45) 31 Average  Story Learning       Immediate Recall 56/80 (40) 16 Below Average    Delayed Recall 28/40 (37) 9 Below Average  Daily Living Memory       Immediate Recall 41/51 (42) 21 Below Average    Delayed Recall 13/17 (36) 8 Well Below Average    Recognition Hits 8/10 (37) 9 Below Average       Attention/Executive Function:     Trail Making Test (TMT): Raw Score (T Score) Percentile     Part A 37 secs.,  0 errors (38) 12 Below Average    Part B  72 secs.,  0 errors (44) 27 Average         Scaled Score Percentile   WAIS-IV Coding: 5 5 Well Below Average        Scaled Score Percentile   WAIS-IV Digit Span: 10 50 Average    Forward 10 50 Average    Backward 10 50 Average    Sequencing 9 37 Average        Scaled Score Percentile   WAIS-IV Similarities: 8 25 Average       D-KEFS Color-Word Interference Test: Raw Score (Scaled Score) Percentile     Color Naming 40 secs. (5) 5 Well Below Average    Word Reading 24 secs. (9) 37 Average    Inhibition 76 secs. (5) 5 Well Below Average      Total Errors 0 errors (12) 75 Above Average    Inhibition/Switching 82 secs. (6) 9 Below Average      Total Errors 2 errors (10) 50 Average       D-KEFS Verbal Fluency Test: Raw Score (Scaled Score) Percentile     Letter Total Correct 38 (10) 50 Average    Category Total Correct 29 (6) 9 Below Average    Category Switching Total Correct 13 (9) 37 Average    Category  Switching Accuracy 11 (9) 37 Average      Total Set Loss Errors 4 (8) 25 Average      Total Repetition Errors 2 (10) 50 Average       Language:     NAB Language Module, Form 1: T Score Percentile     Naming 31/31 (54) 66 Average       Visuospatial/Visuoconstruction:      Raw Score Percentile   Clock Drawing: 8/10 --- Within Normal Limits       NAB Spatial Module, Form 1: T Score Percentile     Visual Discrimination 59 82 Above Average    Figure Drawing Copy 69 97 Well Above Average        Scaled Score Percentile   WAIS-IV Visual Puzzles: 8 25 Average       Mood and Personality:      Raw Score Percentile   Beck Depression Inventory - II: 36 --- Severe  PROMIS Anxiety Questionnaire: 24 --- Moderate       Personality Assessment Inventory: T Score  Percentile     Inconsistency 82 --- Invalid    Infrequency 40 --- Within Normal Limits    Negative Impression 47 --- Within Normal Limits    Positive Impression 41 --- Within Normal Limits    Somatic Complaints 92 ---  Elevated    Anxiety 77 --- Elevated    Anxiety-Related Disorders 63 --- Within Normal Limits    Depression 79 --- Elevated    Mania 49 --- Within Normal Limits    Paranoia 58 --- Within Normal Limits    Schizophrenia 59 --- Within Normal Limits    Borderline Features 56 --- Within Normal Limits    Antisocial Features 50 --- Within Normal Limits    Alcohol Problems 41 --- Within Normal Limits    Drug Problems 58 --- Within Normal Limits    Aggression 48 --- Within Normal Limits    Suicidal Ideation 58 --- Within Normal Limits    Stress 59 --- Within Normal Limits    Non Support 45 --- Within Normal Limits    Treatment Rejection 33 --- Within Normal Limits    Dominance 44 --- Within Normal Limits    Warmth 47 --- Within Normal Limits       Additional Questionnaires:      Raw Score Percentile   PROMIS Sleep Disturbance Questionnaire: 25 --- Mild   Informed Consent and Coding/Compliance:   The current evaluation represents a clinical evaluation for the purposes previously outlined by the referral source and is in no way reflective of a forensic evaluation.   Vicki [name] was provided with a verbal description of the nature and purpose of the present neuropsychological evaluation. Also reviewed were the foreseeable risks and/or discomforts and benefits of the procedure, limits of confidentiality, and mandatory reporting requirements of this provider. The patient was given the opportunity to ask questions and receive answers about the evaluation. Oral consent to participate was provided by the patient.   This evaluation was conducted by Newman Nickels, Ph.D., ABPP-CN, board certified clinical neuropsychologist. Vicki [name] completed a clinical interview with Dr. Milbert Coulter, billed as one unit [phone], and 160 minutes of cognitive testing and scoring, billed as one unit [phone] and four additional units [id number]. Psychometrist Shan Levans, B.S., assisted Dr. Milbert Coulter with test administration and scoring  procedures. As a separate and discrete service, Dr. Milbert Coulter spent a total of 160 minutes in interpretation and report writing billed as one unit [phone] and two  units [id number].

## 2021-07-01 NOTE — Progress Notes (Signed)
   Psychometrician Note   Cognitive testing was administered to Vicki [name] by Shan Levans, B.S. (psychometrist) under the supervision of Dr. Newman Nickels, Ph.D., licensed psychologist on 07/01/2021. [name] did not appear overtly distressed by the testing session per behavioral observation or responses across self-report questionnaires. Rest breaks were offered.    The battery of tests administered was selected by Dr. Newman Nickels, Ph.D. with consideration to [name]'s current level of functioning, the nature of her symptoms, emotional and behavioral responses during interview, level of literacy, observed level of motivation/effort, and the nature of the referral question. This battery was communicated to the psychometrist. Communication between Dr. Newman Nickels, Ph.D. and the psychometrist was ongoing throughout the evaluation and Dr. Newman Nickels, Ph.D. was immediately accessible at all times. Dr. Newman Nickels, Ph.D. provided supervision to the psychometrist on the date of this service to the extent necessary to assure the quality of all services provided.    Vicki [name] will return within approximately 1-2 weeks for an interactive feedback session with Dr. Milbert Coulter at which time her test performances, clinical impressions, and treatment recommendations will be reviewed in detail. [name] understands she can contact our office should she require our assistance before this time.  A total of 160 minutes of billable time were spent face-to-face with [name] by the psychometrist. This includes both test administration and scoring time. Billing for these services is reflected in the clinical report generated by Dr. Newman Nickels, Ph.D.  This note reflects time spent with the psychometrician and does not include test scores or any clinical interpretations made by Dr. Milbert Coulter. The full report will follow in a separate note.

## 2021-07-08 ENCOUNTER — Ambulatory Visit (INDEPENDENT_AMBULATORY_CARE_PROVIDER_SITE_OTHER): Payer: Medicaid Other | Admitting: Psychology

## 2021-07-08 DIAGNOSIS — R4189 Other symptoms and signs involving cognitive functions and awareness: Secondary | ICD-10-CM | POA: Diagnosis not present

## 2021-07-08 DIAGNOSIS — S060X1S Concussion with loss of consciousness of 30 minutes or less, sequela: Secondary | ICD-10-CM

## 2021-07-08 DIAGNOSIS — F411 Generalized anxiety disorder: Secondary | ICD-10-CM | POA: Diagnosis not present

## 2021-07-08 DIAGNOSIS — F332 Major depressive disorder, recurrent severe without psychotic features: Secondary | ICD-10-CM | POA: Diagnosis not present

## 2021-07-08 DIAGNOSIS — F4310 Post-traumatic stress disorder, unspecified: Secondary | ICD-10-CM

## 2021-07-08 DIAGNOSIS — F0781 Postconcussional syndrome: Secondary | ICD-10-CM

## 2021-07-08 NOTE — Progress Notes (Signed)
   Neuropsychology Feedback Session Vicki [name]. [facility]  Reason for Referral:   Vicki [name] is a 41 y.o. right-handed Caucasian female referred by Metta Clines, D.O., to characterize her current cognitive functioning and assist with diagnostic clarity and treatment planning in the context of subjective cognitive decline, a history of numerous concussive injuries, and several significant psychiatric comorbidities.   Feedback:   Vicki [name] completed a comprehensive neuropsychological evaluation on 07/01/2021. Please refer to that encounter for the full report and recommendations. Briefly, results suggested performance variability across domains of processing speed and executive functioning. No cognitive domains exhibited a consistent impairment and greater variability was exhibited across the former relative to the latter. Across mood-related questionnaires, Vicki [name] reported moderate to severe symptoms of anxiety and depression occurring during the past 1-2 weeks. During interview, she also reported a longstanding history of occasionally severe symptoms surrounding these conditions, as well as several prior traumatic experiences. Across a more comprehensive personality questionnaire, she responded in a highly inconsistent manner to the point where the validity of her responses were questioned. However, areas which showed severe elevations (i.e., somatic complaints, anxiety, and depression) align very well with other briefer questionnaires and everything previously discussed. There is clearly a very strong psychiatric component to her current clinical presentation. Ongoing psychiatric distress will absolutely impact neurocognitive functioning. In fact, the most common areas this distress impacts are the very areas which Vicki [name] showed some performance variability (i.e., processing speed and executive functioning). There is a strong possibility that day-to-day  dysfunction described by Vicki [name] is largely caused by ongoing and severe psychiatric distress. This would further be worsened by frequent migraine headaches, chronic pain, and ongoing methadone maintenance.   Vicki [name] was accompanied by her mother during the current feedback session. Content of the current session focused on the results of her neuropsychological evaluation. Vicki [name] was given the opportunity to ask questions and her questions were answered. She was encouraged to reach out should additional questions arise. A copy of her report was provided at the conclusion of the visit.      45 minutes were spent conducting the current feedback session with Vicki [name], billed as one unit [phone].
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
## 2021-08-26 ENCOUNTER — Encounter: Payer: Self-pay | Admitting: Neurology

## 2021-08-26 ENCOUNTER — Ambulatory Visit (INDEPENDENT_AMBULATORY_CARE_PROVIDER_SITE_OTHER): Payer: Medicaid Other | Admitting: Neurology

## 2021-08-26 VITALS — BP 123/83 | HR 105 | Ht <= 58 in | Wt 260.0 lb

## 2021-08-26 DIAGNOSIS — F411 Generalized anxiety disorder: Secondary | ICD-10-CM

## 2021-08-26 DIAGNOSIS — F332 Major depressive disorder, recurrent severe without psychotic features: Secondary | ICD-10-CM | POA: Diagnosis not present

## 2021-08-26 DIAGNOSIS — R4189 Other symptoms and signs involving cognitive functions and awareness: Secondary | ICD-10-CM

## 2021-08-26 NOTE — Patient Instructions (Signed)
Establish care with psychiatry  Continue treatment with pain management Follow up to discuss migraines.
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
## 2022-01-26 ENCOUNTER — Ambulatory Visit: Payer: Medicaid Other | Admitting: Neurology

## 2022-01-26 ENCOUNTER — Encounter: Payer: Self-pay | Admitting: Neurology

## 2022-02-16 ENCOUNTER — Encounter: Payer: Medicaid Other | Admitting: Family Medicine

## 2022-12-02 DIAGNOSIS — F411 Generalized anxiety disorder: Secondary | ICD-10-CM | POA: Diagnosis not present

## 2023-01-16 DIAGNOSIS — Z8739 Personal history of other diseases of the musculoskeletal system and connective tissue: Secondary | ICD-10-CM | POA: Diagnosis not present

## 2023-01-16 DIAGNOSIS — G43909 Migraine, unspecified, not intractable, without status migrainosus: Secondary | ICD-10-CM | POA: Diagnosis not present

## 2023-01-16 DIAGNOSIS — R350 Frequency of micturition: Secondary | ICD-10-CM | POA: Diagnosis not present

## 2023-01-16 DIAGNOSIS — Z6841 Body Mass Index (BMI) 40.0 and over, adult: Secondary | ICD-10-CM | POA: Diagnosis not present

## 2023-01-16 DIAGNOSIS — F411 Generalized anxiety disorder: Secondary | ICD-10-CM | POA: Diagnosis not present

## 2023-01-16 DIAGNOSIS — G8929 Other chronic pain: Secondary | ICD-10-CM | POA: Diagnosis not present
